# Patient Record
Sex: Male | Born: 1940 | Race: White | Hispanic: No | Smoking: Former smoker
Health system: Southern US, Community
[De-identification: ages and names within clinical notes are randomized; demographics above are authoritative.]

## PROBLEM LIST (undated history)

## (undated) DIAGNOSIS — N529 Male erectile dysfunction, unspecified: Secondary | ICD-10-CM

## (undated) DIAGNOSIS — F0280 Dementia in other diseases classified elsewhere without behavioral disturbance: Secondary | ICD-10-CM

## (undated) DIAGNOSIS — E785 Hyperlipidemia, unspecified: Secondary | ICD-10-CM

## (undated) DIAGNOSIS — Z87442 Personal history of urinary calculi: Secondary | ICD-10-CM

## (undated) DIAGNOSIS — N400 Enlarged prostate without lower urinary tract symptoms: Secondary | ICD-10-CM

## (undated) DIAGNOSIS — K5792 Diverticulitis of intestine, part unspecified, without perforation or abscess without bleeding: Secondary | ICD-10-CM

## (undated) DIAGNOSIS — G309 Alzheimer's disease, unspecified: Principal | ICD-10-CM

## (undated) DIAGNOSIS — T7840XA Allergy, unspecified, initial encounter: Secondary | ICD-10-CM

## (undated) DIAGNOSIS — N486 Induration penis plastica: Secondary | ICD-10-CM

## (undated) HISTORY — DX: Diverticulitis of intestine, part unspecified, without perforation or abscess without bleeding: K57.92

## (undated) HISTORY — DX: Induration penis plastica: N48.6

## (undated) HISTORY — PX: TONSILLECTOMY: SHX5217

## (undated) HISTORY — DX: Alzheimer's disease, unspecified: G30.9

## (undated) HISTORY — DX: Male erectile dysfunction, unspecified: N52.9

## (undated) HISTORY — DX: Benign prostatic hyperplasia without lower urinary tract symptoms: N40.0

## (undated) HISTORY — DX: Dementia in other diseases classified elsewhere without behavioral disturbance: F02.80

## (undated) HISTORY — DX: Hyperlipidemia, unspecified: E78.5

## (undated) HISTORY — DX: Personal history of urinary calculi: Z87.442

## (undated) HISTORY — DX: Allergy, unspecified, initial encounter: T78.40XA

---

## 2004-09-26 ENCOUNTER — Encounter: Payer: Self-pay | Admitting: Internal Medicine

## 2004-11-18 ENCOUNTER — Ambulatory Visit: Payer: Self-pay | Admitting: Internal Medicine

## 2005-11-24 ENCOUNTER — Ambulatory Visit: Payer: Self-pay | Admitting: Internal Medicine

## 2006-02-15 ENCOUNTER — Ambulatory Visit: Payer: Self-pay | Admitting: Internal Medicine

## 2006-05-03 ENCOUNTER — Ambulatory Visit: Payer: Self-pay | Admitting: Internal Medicine

## 2006-06-20 ENCOUNTER — Ambulatory Visit: Payer: Self-pay | Admitting: Family Medicine

## 2006-09-03 ENCOUNTER — Ambulatory Visit: Payer: Self-pay | Admitting: Internal Medicine

## 2006-11-30 ENCOUNTER — Ambulatory Visit: Payer: Self-pay | Admitting: Internal Medicine

## 2006-12-17 ENCOUNTER — Ambulatory Visit: Payer: Self-pay | Admitting: Gastroenterology

## 2007-02-08 ENCOUNTER — Ambulatory Visit: Payer: Self-pay | Admitting: Gastroenterology

## 2008-02-13 ENCOUNTER — Ambulatory Visit: Payer: Self-pay | Admitting: Internal Medicine

## 2008-02-13 DIAGNOSIS — L570 Actinic keratosis: Secondary | ICD-10-CM

## 2008-02-13 DIAGNOSIS — N400 Enlarged prostate without lower urinary tract symptoms: Secondary | ICD-10-CM | POA: Insufficient documentation

## 2008-02-13 DIAGNOSIS — N529 Male erectile dysfunction, unspecified: Secondary | ICD-10-CM

## 2008-02-13 DIAGNOSIS — E785 Hyperlipidemia, unspecified: Secondary | ICD-10-CM | POA: Insufficient documentation

## 2008-02-13 DIAGNOSIS — J309 Allergic rhinitis, unspecified: Secondary | ICD-10-CM | POA: Insufficient documentation

## 2008-02-13 DIAGNOSIS — J45909 Unspecified asthma, uncomplicated: Secondary | ICD-10-CM | POA: Insufficient documentation

## 2008-02-13 DIAGNOSIS — K5730 Diverticulosis of large intestine without perforation or abscess without bleeding: Secondary | ICD-10-CM | POA: Insufficient documentation

## 2008-02-18 LAB — CONVERTED CEMR LAB
ALT: 25 units/L (ref 0–53)
AST: 28 units/L (ref 0–37)
BUN: 19 mg/dL (ref 6–23)
Basophils Relative: 0 % (ref 0.0–1.0)
Bilirubin, Direct: 0.1 mg/dL (ref 0.0–0.3)
Cholesterol: 160 mg/dL (ref 0–200)
Eosinophils Relative: 2.5 % (ref 0.0–5.0)
GFR calc non Af Amer: 79 mL/min
HCT: 45.3 % (ref 39.0–52.0)
Hemoglobin: 15 g/dL (ref 13.0–17.0)
LDL Cholesterol: 96 mg/dL (ref 0–99)
Monocytes Absolute: 0.3 10*3/uL (ref 0.2–0.7)
Neutrophils Relative %: 69 % (ref 43.0–77.0)
PSA: 1.83 ng/mL (ref 0.10–4.00)
Phosphorus: 3.4 mg/dL (ref 2.3–4.6)
Potassium: 4.1 meq/L (ref 3.5–5.1)
RBC: 4.91 M/uL (ref 4.22–5.81)
RDW: 12.9 % (ref 11.5–14.6)
Sodium: 140 meq/L (ref 135–145)
Total Bilirubin: 0.9 mg/dL (ref 0.3–1.2)
Total CHOL/HDL Ratio: 3.3
VLDL: 15 mg/dL (ref 0–40)
WBC: 5.7 10*3/uL (ref 4.5–10.5)

## 2008-04-23 ENCOUNTER — Telehealth: Payer: Self-pay | Admitting: Internal Medicine

## 2008-05-29 ENCOUNTER — Ambulatory Visit: Payer: Self-pay | Admitting: Internal Medicine

## 2009-02-24 ENCOUNTER — Ambulatory Visit: Payer: Self-pay | Admitting: Internal Medicine

## 2009-02-25 LAB — CONVERTED CEMR LAB
Albumin: 4.1 g/dL (ref 3.5–5.2)
Alkaline Phosphatase: 76 units/L (ref 39–117)
Basophils Absolute: 0 10*3/uL (ref 0.0–0.1)
Bilirubin, Direct: 0 mg/dL (ref 0.0–0.3)
Cholesterol: 167 mg/dL (ref 0–200)
Eosinophils Absolute: 0.2 10*3/uL (ref 0.0–0.7)
LDL Cholesterol: 100 mg/dL — ABNORMAL HIGH (ref 0–99)
Lymphocytes Relative: 22.9 % (ref 12.0–46.0)
MCHC: 34.8 g/dL (ref 30.0–36.0)
MCV: 92.6 fL (ref 78.0–100.0)
Monocytes Absolute: 0.5 10*3/uL (ref 0.1–1.0)
Neutrophils Relative %: 63.4 % (ref 43.0–77.0)
Phosphorus: 3 mg/dL (ref 2.3–4.6)
Platelets: 167 10*3/uL (ref 150.0–400.0)
Potassium: 4.2 meq/L (ref 3.5–5.1)
RBC: 4.48 M/uL (ref 4.22–5.81)
Sodium: 141 meq/L (ref 135–145)
TSH: 3.46 microintl units/mL (ref 0.35–5.50)
Total CHOL/HDL Ratio: 3
Total Protein: 6.8 g/dL (ref 6.0–8.3)
Triglycerides: 47 mg/dL (ref 0.0–149.0)
VLDL: 9.4 mg/dL (ref 0.0–40.0)

## 2009-08-27 ENCOUNTER — Ambulatory Visit: Payer: Self-pay | Admitting: Internal Medicine

## 2010-02-18 ENCOUNTER — Ambulatory Visit: Payer: Self-pay | Admitting: Internal Medicine

## 2010-02-21 LAB — CONVERTED CEMR LAB
ALT: 28 units/L (ref 0–53)
AST: 24 units/L (ref 0–37)
Alkaline Phosphatase: 66 units/L (ref 39–117)
BUN: 12 mg/dL (ref 6–23)
Bilirubin, Direct: 0.1 mg/dL (ref 0.0–0.3)
Calcium: 9 mg/dL (ref 8.4–10.5)
Cholesterol: 166 mg/dL (ref 0–200)
Eosinophils Absolute: 0.1 10*3/uL (ref 0.0–0.7)
Eosinophils Relative: 2.3 % (ref 0.0–5.0)
GFR calc non Af Amer: 88.97 mL/min (ref >60)
Glucose, Bld: 94 mg/dL (ref 70–99)
HCT: 42.1 % (ref 39.0–52.0)
Lymphs Abs: 0.9 10*3/uL (ref 0.7–4.0)
MCHC: 33.4 g/dL (ref 30.0–36.0)
MCV: 93.9 fL (ref 78.0–100.0)
Monocytes Absolute: 0.4 10*3/uL (ref 0.1–1.0)
PSA: 2.92 ng/mL (ref 0.10–4.00)
Phosphorus: 2.7 mg/dL (ref 2.3–4.6)
Platelets: 161 10*3/uL (ref 150.0–400.0)
Sodium: 142 meq/L (ref 135–145)
Total Bilirubin: 0.6 mg/dL (ref 0.3–1.2)
Total Protein: 6.7 g/dL (ref 6.0–8.3)
VLDL: 11.4 mg/dL (ref 0.0–40.0)
WBC: 4.4 10*3/uL — ABNORMAL LOW (ref 4.5–10.5)

## 2010-04-05 ENCOUNTER — Telehealth: Payer: Self-pay | Admitting: Internal Medicine

## 2010-07-22 ENCOUNTER — Ambulatory Visit: Payer: Self-pay | Admitting: Internal Medicine

## 2010-09-27 ENCOUNTER — Ambulatory Visit: Payer: Self-pay | Admitting: Internal Medicine

## 2010-10-28 ENCOUNTER — Ambulatory Visit: Payer: Self-pay | Admitting: Internal Medicine

## 2010-11-01 LAB — CONVERTED CEMR LAB
ALT: 22 units/L (ref 0–53)
AST: 20 units/L (ref 0–37)
Albumin: 4.7 g/dL (ref 3.5–5.2)
Basophils Absolute: 0 10*3/uL (ref 0.0–0.1)
Basophils Relative: 0 % (ref 0–1)
Calcium: 9.4 mg/dL (ref 8.4–10.5)
Chloride: 105 meq/L (ref 96–112)
MCHC: 34.4 g/dL (ref 30.0–36.0)
Neutro Abs: 4.9 10*3/uL (ref 1.7–7.7)
Neutrophils Relative %: 74 % (ref 43–77)
Potassium: 4.5 meq/L (ref 3.5–5.3)
RBC: 4.72 M/uL (ref 4.22–5.81)
RDW: 13.3 % (ref 11.5–15.5)
Total Protein: 6.9 g/dL (ref 6.0–8.3)

## 2010-12-30 ENCOUNTER — Ambulatory Visit
Admission: RE | Admit: 2010-12-30 | Discharge: 2010-12-30 | Payer: Self-pay | Source: Home / Self Care | Attending: Internal Medicine | Admitting: Internal Medicine

## 2011-01-11 NOTE — Assessment & Plan Note (Signed)
Summary: cpx/rbh   Vital Signs:  Patient profile:   70 year old male Weight:      193 pounds BMI:     29.45 Temp:     98.4 degrees F oral Pulse rate:   72 / minute Pulse rhythm:   regular BP sitting:   108 / 70  (left arm) Cuff size:   large  Vitals Entered By: Mervin Hack CMA Duncan Dull) (February 18, 2010 9:50 AM) CC: adult physical   History of Present Illness: Doing well Having some memory problems--forgets little things, like whether he took meds or not Girlfriend has noted only slight issues No apraxia No apparent change in executive function  No other new issues  Allergies: 1)  ! Penicillin  Past History:  Past medical, surgical, family and social histories (including risk factors) reviewed for relevance to current acute and chronic problems.  Past Medical History: Reviewed history from 05/29/2008 and no changes required. Allergic rhinitis Asthma---post-infectious  2/98 Diverticulosis, colon Hyperlipidemia Peyronie's Erectile dysfunction Benign prostatic hypertrophy Nephrolithiasis, hx of--3/09  Past Surgical History: Reviewed history from 02/24/2009 and no changes required. Tonsillectomy  Family History: Reviewed history from 02/13/2008 and no changes required. Dad died of alcoholism. 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 Eyes:  Denies double vision and vision loss-1 eye. ENT:  Denies decreased hearing and ringing in ears; teeth okay--regular with dentist. CV:  Denies chest pain or discomfort, difficulty breathing at night, difficulty breathing while lying down, fainting, lightheadness, palpitations, and  shortness of breath with exertion. Resp:  Denies cough and shortness of breath. GI:  Denies abdominal pain, bloody stools, change in bowel habits, dark tarry stools, indigestion, nausea, and vomiting. GU:  Complains of erectile dysfunction and urinary hesitancy; denies nocturia; slow urine--occ leakage satisfied with meds. MS:  Complains of joint pain; denies joint swelling; occ mild finger pain--occ takes asa. Derm:  Denies lesion(s) and rash. Neuro:  Denies headaches, numbness, tingling, and weakness. Psych:  Denies anxiety and depression. Heme:  Denies abnormal bruising and enlarge lymph nodes. Allergy:  Complains of seasonal allergies and sneezing; uses meds mostly in summer.  Physical Exam  General:  alert and normal appearance.   Eyes:  pupils equal, pupils round, pupils reactive to light, and no optic disk abnormalities.   Ears:  R ear normal and L ear normal.   Mouth:  no erythema, no exudates, and no lesions.   Neck:  supple, no masses, no thyromegaly, no carotid bruits, and no cervical lymphadenopathy.   Lungs:  normal respiratory effort and normal breath sounds.   Heart:  normal rate, regular rhythm, no murmur, and no gallop.   Abdomen:  soft and non-tender.   Rectal:  no hemorrhoids and no masses.   Prostate:  no nodules.  Very slight gland enlargement but retained median sulcus Msk:  no joint tenderness and no joint swelling.   Pulses:  1+ in feet Extremities:  no edema Neurologic:  alert & oriented X3, strength normal in all extremities, and gait normal.   Skin:  no rashes and no ulcerations.   Multiple benign nevi--discussed looking for change Axillary Nodes:  No palpable lymphadenopathy Psych:  normally interactive, good eye contact, not  anxious appearing, and not depressed appearing.     Impression & Recommendations:  Problem # 1:  PREVENTIVE HEALTH CARE (ICD-V70.0) Assessment Comment Only due for PSA discussed proper eating  Problem # 2:  BENIGN PROSTATIC  HYPERTROPHY (ICD-600.00) Assessment: Unchanged  mild symptoms okay without meds  Orders: TLB-PSA (Prostate Specific Antigen) (84153-PSA)  Problem # 3:  HYPERLIPIDEMIA (ICD-272.4) Assessment: Unchanged  due for labs  His updated medication list for this problem includes:    Lipitor 10 Mg Tabs (Atorvastatin calcium) .Marland Kitchen... Take 1/2  tablet by mouth once a day  Labs Reviewed: SGOT: 31 (02/24/2009)   SGPT: 30 (02/24/2009)   HDL:58.00 (02/24/2009), 49.0 (02/13/2008)  LDL:100 (02/24/2009), 96 (28/31/5176)  Chol:167 (02/24/2009), 160 (02/13/2008)  Trig:47.0 (02/24/2009), 75 (02/13/2008)  Orders: TLB-Lipid Panel (80061-LIPID) TLB-Hepatic/Liver Function Pnl (80076-HEPATIC) Venipuncture (16073) TLB-TSH (Thyroid Stimulating Hormone) (84443-TSH) TLB-CBC Platelet - w/Differential (85025-CBCD) TLB-Renal Function Panel (80069-RENAL)  Problem # 4:  ALLERGIC RHINITIS (ICD-477.9) Assessment: Unchanged uses as needed   His updated medication list for this problem includes:    Flonase 50 Mcg/act Susp (Fluticasone propionate) .Marland Kitchen... 2 sprays each nostril daily  Complete Medication List: 1)  Lipitor 10 Mg Tabs (Atorvastatin calcium) .... Take 1/2  tablet by mouth once a day 2)  Flonase 50 Mcg/act Susp (Fluticasone propionate) .... 2 sprays each nostril daily 3)  Doxazosin Mesylate 4 Mg Tabs (Doxazosin mesylate) .Marland Kitchen.. 1 at bedtime 4)  Viagra 100 Mg Tabs (Sildenafil citrate) .... 1/2 -1 tab about 30-60 minutes before sex  Patient Instructions: 1)  Please schedule a follow-up appointment in 1 year.   Current Allergies (reviewed today): ! PENICILLIN   Tetanus/Td Immunization History:    Tetanus/Td # 1:  Historical (11/30/2006)  Pneumovax Immunization History:    Pneumovax # 1:  Historical (11/30/2006)

## 2011-01-11 NOTE — Assessment & Plan Note (Signed)
Summary: BALL ON BOTTOM OF LEFT FOOT/JRR   Vital Signs:  Patient profile:   70 year old male Weight:      186 pounds Temp:     98.2 degrees F oral BP sitting:   130 / 80  (left arm) Cuff size:   large  Vitals Entered By: Mervin Hack CMA Duncan Dull) (September 27, 2010 9:04 AM) CC: foot pain   History of Present Illness: Having trouble with pain in left forefoot feels "a rock" in there tries to keep it filed down  started several weeks ago No injuries  hasn't changed shoes  walks a lot Uses "tennis shoe"--- he has reasonable Nikes here today  Allergies: 1)  ! Penicillin  Past History:  Past medical, surgical, family and social histories (including risk factors) reviewed for relevance to current acute and chronic problems.  Past Medical History: Reviewed history from 05/29/2008 and no changes required. Allergic rhinitis Asthma---post-infectious  2/98 Diverticulosis, colon Hyperlipidemia Peyronie's Erectile dysfunction Benign prostatic hypertrophy Nephrolithiasis, hx of--3/09  Past Surgical History: Reviewed history from 02/24/2009 and no changes required. Tonsillectomy  Family History: Reviewed history from 02/13/2008 and no changes required. Dad died of alcoholism. Had prostate cancer Mom died of Altzheimers @90  1 brother No CAD, DM, HTN No colon cancer  Social History: Reviewed history from 02/18/2010 and no changes required. 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   Msk:  no visible abnormality on left foot mild tenderness in web space between 2nd and 3rd toes no abnormal pressure or crossing of toes Skin:  no warts   Impression & Recommendations:  Problem # 1:  FOOT PAIN, LEFT (ICD-729.5) Assessment New seems mechanical ??Morton's neuroma  P: try forefoot  support, either in new shoes or with padding under toes    podiatry if continues  Complete Medication List: 1)  Flonase 50 Mcg/act Susp (Fluticasone propionate) .... 2 sprays each nostril daily 2)  Doxazosin Mesylate 4 Mg Tabs (Doxazosin mesylate) .Marland Kitchen.. 1 at bedtime 3)  Viagra 100 Mg Tabs (Sildenafil citrate) .... 1/2 -1 tab about 30-60 minutes before sex 4)  Pravastatin Sodium 80 Mg Tabs (Pravastatin sodium) .... Take 1 by mouth once daily  Patient Instructions: 1)  Consider trying shoes with forefoot support. 2)  If pain persists, the next step is evaluation by a podiatrist 3)  Please schedule a follow-up appointment in 6 months for physical   Orders Added: 1)  Est. Patient Level III [59563]    Current Allergies (reviewed today): ! PENICILLIN  Appended Document: BALL ON BOTTOM OF LEFT FOOT/JRR     Clinical Lists Changes  Orders: Added new Service order of Influenza Vaccine MCR [phone]) - Signed Observations: Added new observation of FLU VAX#1VIS: 07/05/10 version given September 27, 2010. (09/27/2010 9:37) Added new observation of FLU VAXLOT: [id number] (09/27/2010 9:37) Added new observation of FLU VAX EXP: 06/10/2011 (09/27/2010 9:37) Added new observation of FLU VAXBY: DeShannon Smith CMA (AAMA) (09/27/2010 9:37) Added new observation of FLU VAXRTE: IM (09/27/2010 9:37) Added new observation of FLU VAX DSE: 0.5 ml (09/27/2010 9:37) Added new observation of FLU VAXMFR: GlaxoSmithKline (09/27/2010 9:37) Added new observation of FLU VAX SITE: right deltoid (09/27/2010 9:37) Added new observation of FLU VAX: Fluvax MCR (09/27/2010 9:37)       Influenza Vaccine    Vaccine Type: Fluvax MCR    Site: right deltoid  Mfr: GlaxoSmithKline    Dose: 0.5 ml    Route: IM    Given by: Mervin Hack CMA (AAMA)    Exp. Date: 06/10/2011    Lot #: [id number]    VIS given: 07/05/10 version given September 27, 2010.  Flu Vaccine Consent Questions    Do you have a history of  severe allergic reactions to this vaccine? no    Any prior history of allergic reactions to egg and/or gelatin? no    Do you have a sensitivity to the preservative Thimersol? no    Do you have a past history of Guillan-Barre Syndrome? no    Do you currently have an acute febrile illness? no    Have you ever had a severe reaction to latex? no    Vaccine information given and explained to patient? yes

## 2011-01-11 NOTE — Progress Notes (Signed)
Summary: Prior Authorization Lipitor  Phone Note From Pharmacy Call back at ph [phone] fax [phone]   [facility]er[facility]  [address] [phone]* Call For: Dr. Alphonsus Sias  Summary of Call: Received form from pharmacy stating that PA is needed for Lipitor 20.  Rafael Hernandez at [phone] and they will fax form today.  Linde Gillis CMA Duncan Dull)  April 05, 2010 10:18 AM   Still have not received PA form, called Tedd Sias again today and they had the wrong fax number, will send form now.  Linde Gillis CMA (AAMA)  April 07, 2010 9:22 AM   PA form in your IN box.  Initial call taken by: Linde Gillis CMA Duncan Dull),  April 07, 2010 10:45 AM  Follow-up for Phone Call        Please call him His insurance is not willing to fill lipitor unless he has had a documented failure on simvastatin (zocor) or crestor. Has he ever used another chol med? If not, is he willing to try one of these? Follow-up by: Cindee Salt MD,  April 07, 2010 1:10 PM  Additional Follow-up for Phone Call Additional follow up Details #1::        left message on machine at home for patient to return my call.  DeShannon Smith CMA Duncan Dull)  April 07, 2010 3:11 PM    spoke with patient and he has never tried either of these meds, he is willing to try. DeShannon Katrinka Blazing CMA Duncan Dull)  April 08, 2010 8:26   Please send Rx for pravastatin 80mg  daily This is most likely to control his chol like 20mg  of lipitor 1 year Rx Have him check lipid, hepatic in about 1 month Additional Follow-up by: Cindee Salt MD,  April 08, 2010 8:58 AM    Additional Follow-up for Phone Call Additional follow up Details #2::    lmom to have pt re-check labs in 1 month and rx was sent to pharmacy Follow-up by: Mervin Hack CMA [facility] Dull),  April 08, 2010 11:04 AM  New/Updated Medications: PRAVASTATIN SODIUM 80 MG TABS (PRAVASTATIN SODIUM) take 1 by mouth once daily Prescriptions: PRAVASTATIN SODIUM 80 MG TABS (PRAVASTATIN SODIUM) take 1 by mouth once  daily  #30 x 12   Entered by:   Mervin Hack CMA (AAMA)   Authorized by:   Cindee Salt MD   Signed by:   Mervin Hack CMA (AAMA) on 04/08/2010   Method used:   [facility]ically to        [facility] [phone]* (retail)       [address]       [place], [place]  [address]       Ph: [phone]       Fax: [phone]   RxID:   [id number]

## 2011-01-11 NOTE — Assessment & Plan Note (Signed)
Summary: pain in leg/alc   Vital Signs:  Patient profile:   70 year old male Weight:      193 pounds Temp:     98.3 degrees F oral BP sitting:   138 / 68  (left arm) Cuff size:   large  Vitals Entered By: Mervin Hack CMA Duncan Dull) (July 22, 2010 9:14 AM) CC: pain in left hip   History of Present Illness: Having pain in lateral left hip for about 1 week No known injury Notices it mostly when lying down, slightly when sitting No problems with walking or being active Pain gets better with changes in postiion does help to not be lying on that side  Aleve has helped if pain acts up more  Allergies: 1)  ! Penicillin  Past History:  Past medical, surgical, family and social histories (including risk factors) reviewed for relevance to current acute and chronic problems.  Past Medical History: Reviewed history from 05/29/2008 and no changes required. Allergic rhinitis Asthma---post-infectious  2/98 Diverticulosis, colon Hyperlipidemia Peyronie's Erectile dysfunction Benign prostatic hypertrophy Nephrolithiasis, hx of--3/09  Past Surgical History: Reviewed history from 02/24/2009 and no changes required. Tonsillectomy  Family History: Reviewed history from 02/13/2008 and no changes required. Dad died of alcoholism. Had prostate cancer Mom died of Altzheimers @90  1 brother No CAD, DM, HTN No colon cancer  Social History: Reviewed history from 02/18/2010 and no changes required. 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   Msk:  no joint tenderness and no joint swelling.   No back tenderness Mild tenderness  ~4-8cm below anterior superior iliac  spine on left Neurologic:  strength normal in all extremities and gait  normal.     Impression & Recommendations:  Problem # 1:  TROCHANTERIC BURSITIS, LEFT (ICD-726.5) Assessment New discussed that should resolve on its own--fairly mild  will use ice and aleve at bedtime for now  Complete Medication List: 1)  Flonase 50 Mcg/act Susp (Fluticasone propionate) .... 2 sprays each nostril daily 2)  Doxazosin Mesylate 4 Mg Tabs (Doxazosin mesylate) .Marland Kitchen.. 1 at bedtime 3)  Viagra 100 Mg Tabs (Sildenafil citrate) .... 1/2 -1 tab about 30-60 minutes before sex 4)  Pravastatin Sodium 80 Mg Tabs (Pravastatin sodium) .... Take 1 by mouth once daily  Patient Instructions: 1)  Please try ice on the hip for 5-10 minutes and 1-2 aleve before bedtime 2)  Call for injection if pain persists for many weeks 3)  Keep your regualr follow up appt  Current Allergies (reviewed today): ! PENICILLIN

## 2011-01-11 NOTE — Assessment & Plan Note (Signed)
Summary: TROUBLE W/ MEMORY/CLE   Vital Signs:  Patient profile:   70 year old male Weight:      189 pounds Temp:     98.5 degrees F oral Pulse rate:   80 / minute Pulse rhythm:   regular BP sitting:   158 / 80  (left arm) Cuff size:   regular  Vitals Entered By: Mervin Hack CMA Duncan Dull) (October 28, 2010 2:29 PM) CC: problem with memory   History of Present Illness: Here with long term girlfriend-- Bonita Quin something of a long distance relationship  Has had worsening of his memory Probaby goes back about 1 year Will make appt and forgot he did this Puts things away and forgets where remembers "the important things" Clearly has been worsening  Still pays bills and handles all the finances---he pays them as soon as they come in No problems keeping up with 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 Penicillin  Past History:  Past medical, surgical, family and social histories (including risk factors) reviewed for relevance to current acute and chronic problems.  Past Medical History: Reviewed history from 05/29/2008 and no changes required. Allergic rhinitis Asthma---post-infectious  2/98 Diverticulosis, colon Hyperlipidemia Peyronie's Erectile dysfunction Benign prostatic hypertrophy Nephrolithiasis, hx of--3/09  Past Surgical History: Reviewed history from 02/24/2009 and no changes required. Tonsillectomy  Family History: Reviewed history from 02/13/2008 and no changes  required. Dad died of alcoholism. Had prostate cancer Mom died of Altzheimers @90  1 brother No CAD, DM, HTN No colon cancer  Social History: Reviewed history from 02/18/2010 and no changes required. 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   Neurologic:  strength normal in all extremities and gait normal.   Clock draw is normal President-- "Obama, ????" "October, 11" recall 0/3 after about 3 minutes Psych:  normally interactive, good eye contact, not anxious appearing, and not depressed appearing.     Impression & Recommendations:  Problem # 1:  MEMORY LOSS (ICD-780.93) Assessment New  has had some problems but seems to be worsening  biggest argument against Altzheimers is his realization of the problem ??statin related metabolic doesn't appear likely to be vascular no neurologic findings  will check labs stop pravastatin recheck in 1-2 months consider MRI  counselled more than half of 25 minute visit  Orders: T-Comprehensive Metabolic Panel (16109-60454) T-CBC w/Diff (09811-91478) T-Vitamin B12 [id number]) T-Syphilis Test (RPR) (57846-96295) T-T4, Free (28413-24401) Venipuncture (02725) Specimen Handling (36644)  Complete Medication List: 1)  Flonase 50 Mcg/act Susp (Fluticasone propionate) .... 2 sprays each nostril daily 2)  Doxazosin Mesylate 4 Mg Tabs (Doxazosin mesylate) .Marland Kitchen.. 1 at bedtime 3)  Viagra 100 Mg Tabs (Sildenafil citrate) .... 1/2 -1 tab about 30-60 minutes before sex  Patient Instructions: 1)  Please schedule  a follow-up appointment in 1-2  months 2)  Please stop the pravastatin   Orders Added: 1)  T-Comprehensive Metabolic Panel [80053-22900] 2)  T-CBC w/Diff  [16109-60454] 3)  T-Vitamin B12 [82607-23330] 4)  T-Syphilis Test (RPR) [09811-91478] 5)  T-T4, Free [29562-13086] 6)  Venipuncture [57846] 7)  Specimen Handling [99000] 8)  Est. Patient Level IV [96295]    Current Allergies (reviewed today): ! PENICILLIN

## 2011-01-12 NOTE — Assessment & Plan Note (Signed)
Summary: 1-2 month follow up/rbh   Vital Signs:  Patient profile:   70 year old male Weight:      189 pounds BMI:     28.84 Temp:     98.7 degrees F oral Pulse rate:   68 / minute Pulse rhythm:   regular BP sitting:   139 / 72  (left arm) Cuff size:   large  Vitals Entered By: Mervin Hack CMA Duncan Dull) (December 30, 2010 2:54 PM) CC: 1-2 month follow-up   History of Present Illness: Did start the vitamin B12  He and girlfirend hav not noticed any clear cut difference No decline noted either  Has had intermittent depressed mood frustrated with memory problems He feels he hasn't given up anything but girlfriend notes he doesn't go on the computer as much, not checking his e-mail as much, etc  Allergies: 1)  ! Penicillin  Past History:  Past medical, surgical, family and social histories (including risk factors) reviewed for relevance to current acute and chronic problems.  Past Medical History: Reviewed history from 05/29/2008 and no changes required. Allergic rhinitis Asthma---post-infectious  2/98 Diverticulosis, colon Hyperlipidemia Peyronie's Erectile dysfunction Benign prostatic hypertrophy Nephrolithiasis, hx of--3/09  Past Surgical History: Reviewed history from 02/24/2009 and no changes required. Tonsillectomy  Family History: Reviewed history from 02/13/2008 and no changes required. Dad died of alcoholism. Had prostate cancer Mom died of Altzheimers @90  1 brother No CAD, DM, HTN No colon cancer  Social History: Reviewed history from 02/18/2010 and no changes required. 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   Psych:  normally interactive, good eye contact, not anxious appearing, and not depressed appearing.     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 2 sprays each nostril daily 2)  Doxazosin Mesylate 4 Mg Tabs (Doxazosin mesylate) .Marland Kitchen.. 1 at bedtime 3)  Viagra 100 Mg Tabs (Sildenafil citrate) .... 1/2 -1 tab about 30-60 minutes before sex  Patient Instructions: 1)  Please schedule a follow-up appointment in 4 months .    Orders Added: 1)  Est. Patient Level III [42595]    Current Allergies (reviewed today): ! PENICILLIN

## 2011-02-01 ENCOUNTER — Encounter: Payer: Self-pay | Admitting: Internal Medicine

## 2011-02-16 NOTE — Miscellaneous (Signed)
Summary: Health Care Power of Attorney and Advance Directive for a [facility] Power of [place] and Advance Directive for a Natural Death   Imported By: Beau Fanny 02/07/2011 10:53:18  _____________________________________________________________________  External Attachment:    Type:   Image     Comment:   External Document

## 2011-02-20 ENCOUNTER — Encounter: Payer: Self-pay | Admitting: Internal Medicine

## 2011-02-20 ENCOUNTER — Encounter (INDEPENDENT_AMBULATORY_CARE_PROVIDER_SITE_OTHER): Payer: Medicare Other | Admitting: Internal Medicine

## 2011-02-20 ENCOUNTER — Other Ambulatory Visit: Payer: Self-pay | Admitting: Internal Medicine

## 2011-02-20 DIAGNOSIS — Z0000 Encounter for general adult medical examination without abnormal findings: Secondary | ICD-10-CM

## 2011-02-20 DIAGNOSIS — E538 Deficiency of other specified B group vitamins: Secondary | ICD-10-CM

## 2011-02-28 NOTE — Assessment & Plan Note (Signed)
Summary: CPX/DLO   Vital Signs:  Patient profile:   70 year old male Weight:      188 pounds Temp:     98.7 degrees F oral Pulse rate:   66 / minute Pulse rhythm:   regular BP sitting:   140 / 80  (left arm) Cuff size:   large  Vitals Entered By: Mervin Hack CMA Duncan Dull) (February 20, 2011 3:09 PM) CC: adult physical   History of Present Illness: He feels his memory is worse "little things" are harder---has to concentrate on things more, repeat things in his mind forgets where he puts things or whether he paid a bill Girlfriend notes some difference but she is not here today Hasn't gotten lost in car or walking around Mostly just misplaces things   Allergies: 1)  ! Penicillin  Past History:  Past medical, surgical, family and social histories (including risk factors) reviewed for relevance to current acute and chronic problems.  Past Medical History: Allergic rhinitis Asthma---post-infectious  2/98 Diverticulosis, colon Hyperlipidemia Peyronie's Erectile dysfunction Benign prostatic hypertrophy Nephrolithiasis, hx of--3/09 Mild cognitive impairment  Past Surgical History: Reviewed history from 02/24/2009 and no changes required. Tonsillectomy  Family History: Reviewed history from 02/13/2008 and no changes required. Dad died of alcoholism. Had prostate cancer Mom died of Altzheimers @90  1 brother No CAD, DM, HTN No colon cancer  Social History: Reviewed history from 02/18/2010 and no changes required. 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 Eyes:  Denies double vision and vision loss-1 eye. ENT:  Denies decreased hearing and ringing in ears; tteth okay---keeps up with dentist. CV:  Denies chest pain or discomfort, difficulty breathing  at night, difficulty breathing while lying down, fainting, lightheadness, palpitations, and shortness of breath with exertion. Resp:  Denies cough and shortness of breath. GI:  Denies abdominal pain, bloody stools, change in bowel habits, dark tarry stools, indigestion, nausea, and vomiting. GU:  Complains of erectile dysfunction, urinary frequency, and urinary hesitancy; denies nocturia; satisfied with vaigra. MS:  Denies joint pain and joint swelling; has knot on DIP of left 3rd finger. Derm:  Denies lesion(s) and rash. Neuro:  Complains of memory loss; denies headaches, numbness, tingling, and weakness. Psych:  Denies anxiety and depression. Heme:  Denies abnormal bruising and enlarge lymph nodes. Allergy:  Complains of seasonal allergies and sneezing; uses OTC meds as needed .  Physical Exam  General:  alert and normal appearance.   Eyes:  pupils equal, pupils round, pupils reactive to light, and no injection.   Ears:  R ear normal and L ear normal.   Mouth:  no erythema, no exudates, and no lesions.   Neck:  supple, no masses, no thyromegaly, no carotid bruits, and no cervical lymphadenopathy.   Lungs:  normal respiratory effort, no intercostal retractions, no accessory muscle use, and normal breath sounds.   Heart:  normal rate, regular rhythm, no murmur, and no gallop.   Abdomen:  soft, non-tender, and no masses.   Prostate:  deferred after discussion Msk:  no joint tenderness and no joint swelling.   Pulses:  2+ in feet Extremities:  no edema Neurologic:  alert & oriented X3, strength normal in all extremities, and gait normal.   Skin:  no rashes and no suspicious lesions.   Axillary Nodes:  No palpable lymphadenopathy Psych:  normally interactive, good eye contact, not anxious appearing, and not depressed appearing.     Impression & Recommendations:  Problem # 1:  PREVENTIVE HEALTH CARE (ICD-V70.0) Assessment Comment Only discussed regular exercise discussed PSA---will  not check anymore  Problem # 2:  MILD COGNITIVE IMPAIRMENT SO STATED (ICD-331.83) Assessment: Unchanged not clearly worse no worrisome features will see back in 3 months with girlfriend to reassess vitamin B12 shots if level not up  Problem # 3:  BENIGN PROSTATIC HYPERTROPHY (ICD-600.00) Assessment: Unchanged fine on doxazosin  Complete Medication List: 1)  Doxazosin Mesylate 4 Mg Tabs (Doxazosin mesylate) .Marland Kitchen.. 1 at bedtime 2)  Viagra 100 Mg Tabs (Sildenafil citrate) .... 1/2 -1 tab about 30-60 minutes before sex 3)  Vitamin B-12 1000 Mcg Tabs (Cyanocobalamin) .Marland Kitchen.. 1 tab daily  Other Orders: Venipuncture (29562) TLB-B12, Serum-Total ONLY (13086-V78)  Patient Instructions: 1)  Please schedule a follow-up appointment in 3 months with girlfriend   Orders Added: 1)  Venipuncture [36415] 2)  TLB-B12, Serum-Total ONLY [82607-B12] 3)  Est. Patient 65& > [46962]    Current Allergies (reviewed today): ! PENICILLIN

## 2011-04-28 ENCOUNTER — Ambulatory Visit: Payer: Self-pay | Admitting: Internal Medicine

## 2011-06-01 ENCOUNTER — Encounter: Payer: Self-pay | Admitting: Internal Medicine

## 2011-06-02 ENCOUNTER — Encounter: Payer: Self-pay | Admitting: Internal Medicine

## 2011-06-02 ENCOUNTER — Ambulatory Visit (INDEPENDENT_AMBULATORY_CARE_PROVIDER_SITE_OTHER): Payer: Medicare Other | Admitting: Internal Medicine

## 2011-06-02 VITALS — BP 132/70 | HR 97 | Temp 98.5°F | Ht 68.0 in | Wt 190.0 lb

## 2011-06-02 DIAGNOSIS — G3184 Mild cognitive impairment, so stated: Secondary | ICD-10-CM

## 2011-06-02 MED ORDER — DONEPEZIL HCL 10 MG PO TABS: 10.0000 mg | ORAL_TABLET | Freq: Every day | ORAL | Status: DC

## 2011-06-02 NOTE — Progress Notes (Signed)
  Subjective:    Patient ID: Nicholas Kennedy, male    DOB: 10/18/1941, 70 y.o.   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 vitamin B-12 (CYANOCOBALAMIN) 1000 MCG tablet Take 1,000 mcg by mouth daily.        Marland Kitchen DISCONTD: doxazosin (CARDURA) 4 MG tablet Take 4 mg by mouth at bedtime.        Marland Kitchen DISCONTD: sildenafil (VIAGRA) 100 MG tablet Take 50-100 mg by mouth daily as needed.         Past Medical History  Diagnosis Date  . Allergy   . Asthma   . Diverticulitis   . Hyperlipidemia   . Peyronie's disease   . ED (erectile dysfunction)   . BPH (benign prostatic hypertrophy)   . History of nephrolithiasis   . Mild cognitive impairment     Past Surgical History  Procedure Date  . Tonsillectomy     Family History  Problem Relation Age of Onset  . Alzheimer's disease Mother   . Alcohol abuse Father   . Cancer Father     prostate cancer  . Coronary artery disease Neg Hx   . Hypertension Neg Hx   . Diabetes Neg Hx     History   Social History  . Marital Status: Married    Spouse Name: N/A    Number of Children: 3  . Years of Education: N/A   Occupational History  . retired Naval architect   . farm beef cattle    Social History Main Topics  . Smoking status: Former Games developer  . Smokeless tobacco: Never Used  . Alcohol  Use: Yes  . Drug Use: No  . Sexually Active: Not on file   Other Topics Concern  . Not on file   Social History Narrative  . No narrative on file   Review of Systems Appetite is okay Sleeps fine No depression    Objective:   Physical Exam  Constitutional: He is oriented to person, place, and time.  Neurological: He is alert and oriented to person, place, and time.       President--"colored fellow, Danae Orleans, not sure" [phone] D-l-[id number] Recall 0/3 at 1 minute          Assessment & Plan:

## 2011-06-02 NOTE — Assessment & Plan Note (Signed)
Seems to have some small progression He is ready to try medication Will start donepezil and titrate dose to 10mg  Discussed regular physical activity

## 2011-06-02 NOTE — Patient Instructions (Signed)
Please start the donepezil at 1/2 tab (5mg ) daily for 2 weeks. If no problems at that point, increase to a full tab daily Please try to have at least 30 minutes of vigorous physical activity daily

## 2011-08-04 ENCOUNTER — Ambulatory Visit: Payer: Medicare Other | Admitting: Internal Medicine

## 2011-08-07 ENCOUNTER — Encounter: Payer: Self-pay | Admitting: Internal Medicine

## 2011-08-07 ENCOUNTER — Ambulatory Visit (INDEPENDENT_AMBULATORY_CARE_PROVIDER_SITE_OTHER): Payer: Medicare Other | Admitting: Internal Medicine

## 2011-08-07 VITALS — BP 126/70 | HR 61 | Temp 98.0°F | Ht 68.0 in | Wt 187.0 lb

## 2011-08-07 DIAGNOSIS — G3184 Mild cognitive impairment, so stated: Secondary | ICD-10-CM

## 2011-08-07 NOTE — Progress Notes (Signed)
  Subjective:    Patient ID: Nicholas Kennedy, male    DOB: Oct 19, 1941, 70 y.o.   MRN: [id number]  HPI No problems with the medicine Girlfriend and he don't note any improvement No further decline  Brings in "procera AVH" cognitive enhancer Okay to try but should be limited trial Current Outpatient Prescriptions on File Prior to Visit  Medication Sig Dispense Refill  . donepezil (ARICEPT) 10 MG tablet Take 1 tablet (10 mg total) by mouth daily.  30 tablet  11  . vitamin B-12 (CYANOCOBALAMIN) 1000 MCG tablet Take 1,000 mcg by mouth daily.          Allergies  Allergen Reactions  . Penicillins     Past Medical History  Diagnosis Date  . Allergy   . Asthma   . Diverticulitis   . Hyperlipidemia   . Peyronie's disease   . ED (erectile dysfunction)   . BPH (benign prostatic hypertrophy)   . History of nephrolithiasis   . Mild cognitive impairment     Past Surgical History  Procedure Date  . Tonsillectomy     Family History  Problem Relation Age of Onset  . Alzheimer's disease Mother   . Alcohol abuse Father   . Cancer Father     prostate cancer  . Coronary artery disease Neg Hx   . Hypertension Neg Hx   . Diabetes Neg Hx     History   Social History  . Marital Status: Married    Spouse Name: N/A    Number of Children: 3  . Years of Education: N/A   Occupational History  . retired Naval architect   . farm beef cattle    Social History Main Topics  . Smoking status: Former Games developer  . Smokeless tobacco: Never Used  . Alcohol Use: Yes  . Drug Use: No  . Sexually Active: Not on file   Other Topics Concern  . Not on file   Social History Narrative  . No narrative on file      Review of Systems     Objective:   Physical Exam        Assessment & Plan:

## 2011-08-07 NOTE — Patient Instructions (Signed)
Please get full hearing evaluation at [facility] ENT audiologists

## 2011-08-07 NOTE — Assessment & Plan Note (Signed)
No clear response counselled over half of 15 minute visit He is willing to continue if chance of slowing decline Discussed hearing evaluation

## 2011-08-25 ENCOUNTER — Other Ambulatory Visit: Payer: Self-pay | Admitting: Internal Medicine

## 2011-11-20 ENCOUNTER — Other Ambulatory Visit: Payer: Self-pay | Admitting: Internal Medicine

## 2012-02-02 ENCOUNTER — Ambulatory Visit: Payer: Medicare Other | Admitting: Internal Medicine

## 2012-03-11 ENCOUNTER — Encounter: Payer: Self-pay | Admitting: Internal Medicine

## 2012-03-11 ENCOUNTER — Ambulatory Visit (INDEPENDENT_AMBULATORY_CARE_PROVIDER_SITE_OTHER): Payer: Medicare Other | Admitting: Internal Medicine

## 2012-03-11 VITALS — BP 128/68 | HR 65 | Temp 97.8°F | Ht 68.0 in | Wt 187.0 lb

## 2012-03-11 DIAGNOSIS — J309 Allergic rhinitis, unspecified: Secondary | ICD-10-CM

## 2012-03-11 DIAGNOSIS — J45909 Unspecified asthma, uncomplicated: Secondary | ICD-10-CM

## 2012-03-11 DIAGNOSIS — G3184 Mild cognitive impairment, so stated: Secondary | ICD-10-CM

## 2012-03-11 DIAGNOSIS — N400 Enlarged prostate without lower urinary tract symptoms: Secondary | ICD-10-CM

## 2012-03-11 MED ORDER — DOXAZOSIN MESYLATE 4 MG PO TABS: 4.0000 mg | ORAL_TABLET | Freq: Every day | ORAL | Status: DC

## 2012-03-11 MED ORDER — DONEPEZIL HCL 10 MG PO TABS: 10.0000 mg | ORAL_TABLET | Freq: Every day | ORAL | Status: DC

## 2012-03-11 NOTE — Assessment & Plan Note (Signed)
Mild memory worsening but no functional changes Still fine with executive function Continue donepezil---consider trial off in a year or so to see if there is a difference off the med

## 2012-03-11 NOTE — Progress Notes (Signed)
  Subjective:    Patient ID: Nicholas Kennedy, male    DOB: 12-May-1941, 71 y.o.   MRN: [id number]  HPI Here with girlfriend He feels his memory is worse Misplaces things Forgets some appts---tries to take notes Repeats questions  Still drives Never has gotten lost Still handles the finances--paying bills, etc  Still farms---cattle  Voiding okay No dizziness on cardura No nocturia  No asthma problems No wheezing, cough or SOB  Current Outpatient Prescriptions on File Prior to Visit  Medication Sig Dispense Refill  . VIAGRA 100 MG tablet TAKE 1/2 TO 1 TABLET ABOUT 30-60 MINUTES BEFORE SEX  6 tablet  3  . DISCONTD: donepezil (ARICEPT) 10 MG tablet Take 1 tablet (10 mg total) by mouth daily.  30 tablet  11  . DISCONTD: doxazosin (CARDURA) 4 MG tablet TAKE 1 TABLET BY MOUTH EVERY DAY AT BEDTIME  30 tablet  11    Allergies  Allergen Reactions  . Penicillins     Past Medical History  Diagnosis Date  . Allergy   . Asthma   . Diverticulitis   . Hyperlipidemia   . Peyronie's disease   . ED (erectile dysfunction)   . BPH (benign prostatic hypertrophy)   . History of nephrolithiasis   . Mild cognitive impairment     Past Surgical History  Procedure Date  . Tonsillectomy     Family History  Problem Relation Age of Onset  . Alzheimer's disease Mother   . Alcohol abuse Father   . Cancer Father     prostate cancer  . Coronary artery disease Neg Hx   . Hypertension Neg Hx   . Diabetes Neg Hx     History   Social History  . Marital Status: Married    Spouse Name: N/A    Number of Children: 3  . Years of Education: N/A   Occupational History  . retired Naval architect   . farm beef cattle    Social History Main Topics  . Smoking status: Former Games developer  . Smokeless tobacco: Never Used  . Alcohol Use: Yes  . Drug Use: No  . Sexually Active: Not on file   Other Topics Concern  . Not on file   Social History Narrative  . No narrative on file   Review of  Systems Sleeps well Gets irritated when girlfriend gets frustrated (at his repeated questions) Sleeps okay Mood generally okay    Objective:   Physical Exam  Constitutional: He appears well-developed and well-nourished. No distress.  Neck: Normal range of motion. Neck supple. No thyromegaly present.  Cardiovascular: Normal rate, regular rhythm and normal heart sounds.  Exam reveals no gallop.   No murmur heard. Pulmonary/Chest: Effort normal and breath sounds normal. No respiratory distress. He has no wheezes. He has no rales.  Abdominal: Soft. There is no tenderness.  Musculoskeletal: He exhibits no edema and no tenderness.  Lymphadenopathy:    He has no cervical adenopathy.  Psychiatric: He has a normal mood and affect. His behavior is normal.          Assessment & Plan:

## 2012-03-11 NOTE — Assessment & Plan Note (Signed)
Has been quiet 

## 2012-03-11 NOTE — Assessment & Plan Note (Signed)
Voids well with the med No changes

## 2012-03-11 NOTE — Assessment & Plan Note (Signed)
Starts in May---probably grass sensitive Okay with OTC meds

## 2012-10-11 ENCOUNTER — Encounter: Payer: Self-pay | Admitting: Internal Medicine

## 2012-10-11 ENCOUNTER — Ambulatory Visit (INDEPENDENT_AMBULATORY_CARE_PROVIDER_SITE_OTHER): Payer: Medicare Other | Admitting: Internal Medicine

## 2012-10-11 VITALS — BP 116/78 | HR 66 | Temp 98.0°F | Ht 66.5 in | Wt 183.2 lb

## 2012-10-11 DIAGNOSIS — N400 Enlarged prostate without lower urinary tract symptoms: Secondary | ICD-10-CM

## 2012-10-11 DIAGNOSIS — J45909 Unspecified asthma, uncomplicated: Secondary | ICD-10-CM

## 2012-10-11 DIAGNOSIS — Z0000 Encounter for general adult medical examination without abnormal findings: Secondary | ICD-10-CM

## 2012-10-11 DIAGNOSIS — Z23 Encounter for immunization: Secondary | ICD-10-CM

## 2012-10-11 DIAGNOSIS — E538 Deficiency of other specified B group vitamins: Secondary | ICD-10-CM

## 2012-10-11 DIAGNOSIS — G3184 Mild cognitive impairment, so stated: Secondary | ICD-10-CM

## 2012-10-11 LAB — CBC WITH DIFFERENTIAL/PLATELET
Basophils Absolute: 0 10*3/uL (ref 0.0–0.1)
Eosinophils Absolute: 0.1 10*3/uL (ref 0.0–0.7)
Hemoglobin: 14.9 g/dL (ref 13.0–17.0)
Lymphocytes Relative: 15.6 % (ref 12.0–46.0)
MCHC: 32.8 g/dL (ref 30.0–36.0)
Neutro Abs: 5.4 10*3/uL (ref 1.4–7.7)
Neutrophils Relative %: 76.6 % (ref 43.0–77.0)
RDW: 14.2 % (ref 11.5–14.6)

## 2012-10-11 LAB — BASIC METABOLIC PANEL
CO2: 28 mEq/L (ref 19–32)
Calcium: 9 mg/dL (ref 8.4–10.5)
Creatinine, Ser: 1.1 mg/dL (ref 0.4–1.5)
Glucose, Bld: 92 mg/dL (ref 70–99)

## 2012-10-11 LAB — HEPATIC FUNCTION PANEL
Albumin: 4.2 g/dL (ref 3.5–5.2)
Bilirubin, Direct: 0 mg/dL (ref 0.0–0.3)
Total Protein: 7.2 g/dL (ref 6.0–8.3)

## 2012-10-11 NOTE — Assessment & Plan Note (Signed)
Very quiet No symptoms and no Rx

## 2012-10-11 NOTE — Assessment & Plan Note (Signed)
No longer taking supplements Will recheck level

## 2012-10-11 NOTE — Progress Notes (Signed)
Subjective:    Patient ID: Nicholas Kennedy, male    DOB: [birth year], 71 y.o.   MRN: [id number]  HPI Here with girlfriend For wellness visit and follow up Reviewed advanced directives No depression or anhedonia Remains independent with ADLs and instrumental ADLs No smoking or alcohol No other regular doctors Due for flu vaccine Stays active on farm  Feels his reasoning is fine Still pays the bills Memory is poor---forgets things. Girlfriend notices him repeating himself--this is more noticeable She isn't sure he is taking the donepezil regularly--discussed using weekly or monthly pill containers  Voiding okay No dizziness on the med No nocturia  Asthma is quiet No cough or SOB No wheezing No rescue inhaler  Current Outpatient Prescriptions on File Prior to Visit  Medication Sig Dispense Refill  . donepezil (ARICEPT) 10 MG tablet Take 1 tablet (10 mg total) by mouth daily.  30 tablet  11  . doxazosin (CARDURA) 4 MG tablet Take 1 tablet (4 mg total) by mouth at bedtime.  30 tablet  11  . VIAGRA 100 MG tablet TAKE 1/2 TO 1 TABLET ABOUT 30-60 MINUTES BEFORE SEX  6 tablet  3    Allergies  Allergen Reactions  . Penicillins     Past Medical History  Diagnosis Date  . Allergy   . Asthma   . Diverticulitis   . Hyperlipidemia   . Peyronie's disease   . ED (erectile dysfunction)   . BPH (benign prostatic hypertrophy)   . History of nephrolithiasis   . Mild cognitive impairment     Past Surgical History  Procedure Date  . Tonsillectomy     Family History  Problem Relation Age of Onset  . Alzheimer's disease Mother   . Alcohol abuse Father   . Cancer Father     prostate cancer  . Coronary artery disease Neg Hx   . Hypertension Neg Hx   . Diabetes Neg Hx     History   Social History  . Marital Status: Married    Spouse Name: N/A    Number of Children: 3  . Years of Education: N/A   Occupational History  . retired Naval architect   . farm beef cattle     Social History Main Topics  . Smoking status: Former Games developer  . Smokeless tobacco: Never Used  . Alcohol Use: No  . Drug Use: No  . Sexually Active: Not on file   Other Topics Concern  . Not on file   Social History Narrative   No living willGirlfriend [employer] or son, Tawanna Cooler has health care POAWould accept resuscitationHasn't thought about feeding tube   Review of Systems Satisfied with the viagra Sleeps okay Appetite is good Weight is down 4# since last visit    Objective:   Physical Exam  Constitutional: He appears well-developed and well-nourished. No distress.  HENT:  Mouth/Throat: Oropharynx is clear and moist. No oropharyngeal exudate.  Eyes: Conjunctivae normal and EOM are normal. Pupils are equal, round, and reactive to light.  Neck: Normal range of motion. Neck supple.  Cardiovascular: Normal rate, regular rhythm, normal heart sounds and intact distal pulses.  Exam reveals no gallop.   No murmur heard. Pulmonary/Chest: Effort normal and breath sounds normal. No respiratory distress. He has no wheezes. He has no rales.  Abdominal: Soft. There is no tenderness.  Musculoskeletal: He exhibits no edema and no tenderness.  Lymphadenopathy:    He has no cervical adenopathy.  Neurological: He is alert.  Skin: No  rash noted. No erythema.  Psychiatric: He has a normal mood and affect. His behavior is normal.          Assessment & Plan:

## 2012-10-11 NOTE — Assessment & Plan Note (Signed)
?  some decline with more repeating Not taking donepezil daily---discussed daily med planner

## 2012-10-11 NOTE — Assessment & Plan Note (Signed)
I have personally reviewed the Medicare Annual Wellness questionnaire and have noted 1. The patient's medical and social history 2. Their use of alcohol, tobacco or illicit drugs 3. Their current medications and supplements 4. The patient's functional ability including ADL's, fall risks, home safety risks and hearing or visual             impairment. 5. Diet and physical activities 6. Evidence for depression or mood disorders  The patients weight, height, BMI and visual acuity have been recorded in the chart I have made referrals, counseling and provided education to the patient based review of the above and I have provided the pt with a written personalized care plan for preventive services.  I have provided you with a copy of your personalized plan for preventive services. Please take the time to review along with your updated medication list.  Flu today Rx for zostavax No PSA due to age UTD otherwise

## 2012-10-11 NOTE — Assessment & Plan Note (Signed)
Doing okay with the medicine No changes needed

## 2012-10-15 ENCOUNTER — Encounter: Payer: Self-pay | Admitting: *Deleted

## 2012-12-11 DIAGNOSIS — F0280 Dementia in other diseases classified elsewhere without behavioral disturbance: Secondary | ICD-10-CM

## 2012-12-11 HISTORY — DX: Dementia in other diseases classified elsewhere, unspecified severity, without behavioral disturbance, psychotic disturbance, mood disturbance, and anxiety: F02.80

## 2013-04-06 ENCOUNTER — Other Ambulatory Visit: Payer: Self-pay | Admitting: Internal Medicine

## 2013-04-09 ENCOUNTER — Ambulatory Visit: Payer: No Typology Code available for payment source | Admitting: Internal Medicine

## 2013-04-11 ENCOUNTER — Ambulatory Visit: Payer: No Typology Code available for payment source | Admitting: Internal Medicine

## 2013-04-21 ENCOUNTER — Ambulatory Visit: Payer: No Typology Code available for payment source | Admitting: Internal Medicine

## 2013-04-28 ENCOUNTER — Encounter: Payer: Self-pay | Admitting: Internal Medicine

## 2013-04-28 ENCOUNTER — Ambulatory Visit (INDEPENDENT_AMBULATORY_CARE_PROVIDER_SITE_OTHER): Payer: Medicare Other | Admitting: Internal Medicine

## 2013-04-28 VITALS — BP 112/70 | HR 62 | Temp 98.0°F | Wt 179.0 lb

## 2013-04-28 DIAGNOSIS — N400 Enlarged prostate without lower urinary tract symptoms: Secondary | ICD-10-CM

## 2013-04-28 DIAGNOSIS — F0281 Dementia in other diseases classified elsewhere with behavioral disturbance: Secondary | ICD-10-CM | POA: Insufficient documentation

## 2013-04-28 DIAGNOSIS — J309 Allergic rhinitis, unspecified: Secondary | ICD-10-CM

## 2013-04-28 DIAGNOSIS — F0280 Dementia in other diseases classified elsewhere without behavioral disturbance: Secondary | ICD-10-CM

## 2013-04-28 DIAGNOSIS — G309 Alzheimer's disease, unspecified: Secondary | ICD-10-CM | POA: Insufficient documentation

## 2013-04-28 NOTE — Assessment & Plan Note (Signed)
Voiding fine No changes needed

## 2013-04-28 NOTE — Progress Notes (Signed)
Subjective:    Patient ID: Nicholas Kennedy, male    DOB: [birth year], 72 y.o.   MRN: [id number]  HPI Here with girlfriend She feels that he is still not remembering meds all the time---he says everything is okay Still takes out of the bottle They see each other inconsistently so it would be hard for her to take control  She notes some decline He comes to her house (for overnight)---doesn't bring meds or toiletries He forgets bills and papers for accountant Can't keep up with checkbook Repeating himself more Still drives, buys prepared foods or eats sandwiches (or take out) Does okay with laundry  Voids okay No incontinence Intermittent nocturia No daytime problems  Current Outpatient Prescriptions on File Prior to Visit  Medication Sig Dispense Refill  . donepezil (ARICEPT) 10 MG tablet TAKE 1 TABLET BY MOUTH EVERY DAY  30 tablet  7  . doxazosin (CARDURA) 4 MG tablet Take 1 tablet (4 mg total) by mouth at bedtime.  30 tablet  11  . VIAGRA 100 MG tablet TAKE 1/2 TO 1 TABLET ABOUT 30-60 MINUTES BEFORE SEX  6 tablet  3   No current facility-administered medications on file prior to visit.    Allergies  Allergen Reactions  . Penicillins     Past Medical History  Diagnosis Date  . Allergy   . Asthma   . Diverticulitis   . Hyperlipidemia   . Peyronie's disease   . ED (erectile dysfunction)   . BPH (benign prostatic hypertrophy)   . History of nephrolithiasis   . Mild cognitive impairment     Past Surgical History  Procedure Laterality Date  . Tonsillectomy      Family History  Problem Relation Age of Onset  . Alzheimer's disease Mother   . Alcohol abuse Father   . Cancer Father     prostate cancer  . Coronary artery disease Neg Hx   . Hypertension Neg Hx   . Diabetes Neg Hx     History   Social History  . Marital Status: Married    Spouse Name: N/A    Number of Children: 3  . Years of Education: N/A   Occupational History  . retired Naval architect    . farm beef cattle    Social History Main Topics  . Smoking status: Former Games developer  . Smokeless tobacco: Never Used  . Alcohol Use: No  . Drug Use: No  . Sexually Active: Not on file   Other Topics Concern  . Not on file   Social History Narrative   No living will   Girlfriend Madaline Savage or son, Tawanna Cooler has health care POA   Would accept resuscitation   Hasn't thought about feeding tube   Review of Systems Daughter lives in [address]---but planning to move and live her soon. Can help then Sleeps well Generally stays satisifed---no depression     Objective:   Physical Exam  Constitutional: He appears well-developed and well-nourished. No distress.  Neck: Normal range of motion. Neck supple.  Cardiovascular: Normal rate, regular rhythm and normal heart sounds.  Exam reveals no gallop.   No murmur heard. Pulmonary/Chest: Effort normal and breath sounds normal. No respiratory distress. He has no wheezes. He has no rales.  Abdominal: Soft. There is no tenderness.  Musculoskeletal: He exhibits no edema and no tenderness.  Lymphadenopathy:    He has no cervical adenopathy.  Neurological:  Limited insight Judgement appears impaired  Psychiatric: He has a normal mood and  affect. His behavior is normal.  Good spirits          Assessment & Plan:

## 2013-04-28 NOTE — Assessment & Plan Note (Signed)
Milder than in past Not using meds now

## 2013-04-28 NOTE — Assessment & Plan Note (Addendum)
Now seems to fit criteria for mild Alzheimer's Decline in executive function in addition to memory Impaired judgement and insight Needs more help---hopefully daughter will be moving here soon (if not, may need to hire aides)  No serious affective problems at present Just some frustration

## 2013-11-10 ENCOUNTER — Encounter: Payer: Medicare Other | Admitting: Internal Medicine

## 2013-11-10 DIAGNOSIS — Z0289 Encounter for other administrative examinations: Secondary | ICD-10-CM

## 2013-12-01 ENCOUNTER — Other Ambulatory Visit: Payer: Self-pay | Admitting: Internal Medicine

## 2013-12-16 ENCOUNTER — Other Ambulatory Visit: Payer: Self-pay | Admitting: Internal Medicine

## 2014-01-13 ENCOUNTER — Other Ambulatory Visit: Payer: Self-pay | Admitting: Internal Medicine

## 2014-01-14 ENCOUNTER — Telehealth: Payer: Self-pay

## 2014-01-14 NOTE — Telephone Encounter (Signed)
Delice Bisonara pts daughter said pt is having more problem with memory and dementia; Delice Bisonara wants to know if pt should have change of medication or should pt see a specialist. Delice Bisonara is not sure if pt is presently remembering to take his med. Delice Bisonara plans to come with pt on 01/28/14 appt but was not sure if should wait that long to let Dr Carola RhineLetak know pt's short term memory has significantly deteriorated in last 3 months.[facility] [place] [place] [facility].

## 2014-01-14 NOTE — Telephone Encounter (Signed)
This does not sound acute although I completely understand her concern.  This can wait for Dr. Karle StarchLetvak's return on Monday and would be best for him to manage since he is his PCP.

## 2014-01-15 NOTE — Telephone Encounter (Signed)
.  left message to have patient's daughter return my call.  

## 2014-01-22 NOTE — Telephone Encounter (Signed)
.  left message to have patient's daughter return my call.  

## 2014-01-28 ENCOUNTER — Encounter: Payer: Medicare Other | Admitting: Internal Medicine

## 2014-02-09 ENCOUNTER — Encounter: Payer: Self-pay | Admitting: Internal Medicine

## 2014-02-09 ENCOUNTER — Ambulatory Visit (INDEPENDENT_AMBULATORY_CARE_PROVIDER_SITE_OTHER): Payer: Medicare Other | Admitting: Internal Medicine

## 2014-02-09 VITALS — BP 140/80 | HR 61 | Temp 98.5°F | Ht 67.0 in | Wt 187.0 lb

## 2014-02-09 DIAGNOSIS — N400 Enlarged prostate without lower urinary tract symptoms: Secondary | ICD-10-CM

## 2014-02-09 DIAGNOSIS — Z0000 Encounter for general adult medical examination without abnormal findings: Secondary | ICD-10-CM

## 2014-02-09 DIAGNOSIS — G309 Alzheimer's disease, unspecified: Secondary | ICD-10-CM

## 2014-02-09 DIAGNOSIS — J309 Allergic rhinitis, unspecified: Secondary | ICD-10-CM

## 2014-02-09 DIAGNOSIS — F0280 Dementia in other diseases classified elsewhere without behavioral disturbance: Secondary | ICD-10-CM

## 2014-02-09 DIAGNOSIS — E785 Hyperlipidemia, unspecified: Secondary | ICD-10-CM

## 2014-02-09 LAB — COMPREHENSIVE METABOLIC PANEL
ALT: 27 U/L (ref 0–53)
AST: 23 U/L (ref 0–37)
Albumin: 4.2 g/dL (ref 3.5–5.2)
Alkaline Phosphatase: 67 U/L (ref 39–117)
BUN: 18 mg/dL (ref 6–23)
CALCIUM: 9 mg/dL (ref 8.4–10.5)
CHLORIDE: 106 meq/L (ref 96–112)
CO2: 27 meq/L (ref 19–32)
Creatinine, Ser: 0.9 mg/dL (ref 0.4–1.5)
GFR: 85.76 mL/min (ref >60.00)
Glucose, Bld: 97 mg/dL (ref 70–99)
Potassium: 3.7 mEq/L (ref 3.5–5.1)
SODIUM: 140 meq/L (ref 135–145)
Total Bilirubin: 0.9 mg/dL (ref 0.3–1.2)
Total Protein: 7.2 g/dL (ref 6.0–8.3)

## 2014-02-09 LAB — LIPID PANEL
Cholesterol: 237 mg/dL — ABNORMAL HIGH (ref 0–200)
HDL: 52.1 mg/dL (ref >39.00)
LDL Cholesterol: 166 mg/dL — ABNORMAL HIGH (ref 0–99)
Total CHOL/HDL Ratio: 5
Triglycerides: 93 mg/dL (ref 0.0–149.0)
VLDL: 18.6 mg/dL (ref 0.0–40.0)

## 2014-02-09 LAB — CBC WITH DIFFERENTIAL/PLATELET
Basophils Absolute: 0 10*3/uL (ref 0.0–0.1)
Basophils Relative: 0.5 % (ref 0.0–3.0)
EOS ABS: 0.1 10*3/uL (ref 0.0–0.7)
Eosinophils Relative: 1.1 % (ref 0.0–5.0)
HCT: 43.2 % (ref 39.0–52.0)
Hemoglobin: 14.3 g/dL (ref 13.0–17.0)
LYMPHS PCT: 12.5 % (ref 12.0–46.0)
Lymphs Abs: 0.9 10*3/uL (ref 0.7–4.0)
MCHC: 33.1 g/dL (ref 30.0–36.0)
MCV: 93.6 fl (ref 78.0–100.0)
MONOS PCT: 6.7 % (ref 3.0–12.0)
Monocytes Absolute: 0.5 10*3/uL (ref 0.1–1.0)
Neutro Abs: 5.6 10*3/uL (ref 1.4–7.7)
Neutrophils Relative %: 79.2 % — ABNORMAL HIGH (ref 43.0–77.0)
PLATELETS: 206 10*3/uL (ref 150.0–400.0)
RBC: 4.62 Mil/uL (ref 4.22–5.81)
RDW: 14.9 % — ABNORMAL HIGH (ref 11.5–14.6)
WBC: 7.1 10*3/uL (ref 4.5–10.5)

## 2014-02-09 LAB — T4, FREE: FREE T4: 0.95 ng/dL (ref 0.60–1.60)

## 2014-02-09 LAB — TSH: TSH: 2.93 u[IU]/mL (ref 0.35–5.50)

## 2014-02-09 MED ORDER — ZOSTER VACCINE LIVE 19400 UNT/0.65ML ~~LOC~~ SOLR: 0.6500 mL | Freq: Once | SUBCUTANEOUS | Status: DC

## 2014-02-09 NOTE — Patient Instructions (Addendum)
Please call Zerita BoersHeather McKay for an assessment. She is a dementia specialist with [facility]. There is no charge for this. The number is [phone]. You need to stop driving---I have asked your daughter to take away your keys>

## 2014-02-09 NOTE — Assessment & Plan Note (Signed)
Slowly progressive Should not be driving Referred daughter to Zerita BoersHeather McKay Some apraxia, further decline in executive function, insight and judgement

## 2014-02-09 NOTE — Assessment & Plan Note (Signed)
Healthy other than the dementia Rx for zostavax UTD on colon  No PSA due to age

## 2014-02-09 NOTE — Assessment & Plan Note (Signed)
Voiding okay on doxazosin
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
## 2014-02-09 NOTE — Assessment & Plan Note (Signed)
Does okay with OTC meds in season

## 2014-02-09 NOTE — Assessment & Plan Note (Signed)
Rx not appropriate given dementia (Alzheimers)

## 2014-02-09 NOTE — Progress Notes (Signed)
Pre visit review using our clinic review tool, if applicable. No additional management support is needed unless otherwise documented below in the visit note. 

## 2014-02-11 ENCOUNTER — Encounter: Payer: Self-pay | Admitting: *Deleted

## 2014-02-23 ENCOUNTER — Telehealth: Payer: Self-pay

## 2014-02-23 NOTE — Telephone Encounter (Signed)
She should report her concerns to Motor Vehicles---they can revoke his license if needed. Unfortunately, that will not stop many people since he denies the memory problems. Often, an intervention to take away keys and car is needed (but certainly not easy)

## 2014-02-23 NOTE — Telephone Encounter (Signed)
Nicholas Kennedy pts daughter said pt was advised by Dr Alphonsus SiasLetvak at recent office visit to stop driving. Nicholas Kennedy said pt is reluctant to stop driving and Nicholas Kennedy wants to know what is next step to have pt stop driving. Tara request cb [phone].

## 2014-02-25 NOTE — Telephone Encounter (Signed)
Tara left v/m requesting cb about pt driving.

## 2014-02-25 NOTE — Telephone Encounter (Signed)
Spoke with Delice Bisonara and advised results, they do have an intervention set up for next weekend. They also might want an OV to discuss with Dr. Alphonsus SiasLetvak that pt can't make decisions for himself, could he be declared mentality incompetent?

## 2014-02-26 NOTE — Telephone Encounter (Signed)
Tara left v/m; [facility] request for pt to stop driving; the form requires doctors signature and Delice Bisonara wants to know if Dr Alphonsus SiasLetvak will sign medical request form.Please advise.

## 2014-02-26 NOTE — Telephone Encounter (Signed)
Yes , I will

## 2014-02-26 NOTE — Telephone Encounter (Signed)
Mental competence is actually a legal issue. I certainly could attest that I think he is limited in decision making at this point and needs financial assistance

## 2014-02-26 NOTE — Telephone Encounter (Signed)
Spoke with daughter and advised results.  

## 2014-03-16 ENCOUNTER — Telehealth: Payer: Self-pay

## 2014-03-16 DIAGNOSIS — G309 Alzheimer's disease, unspecified: Principal | ICD-10-CM

## 2014-03-16 DIAGNOSIS — F0280 Dementia in other diseases classified elsewhere without behavioral disturbance: Secondary | ICD-10-CM

## 2014-03-16 NOTE — Telephone Encounter (Signed)
Delice Bisonara pts daughter left v/m; Delice Bisonara spoke with pts ins co and Delice Bisonara is requesting order for in home care to assist pt with cooking and cleaning in the home a couple of hours daily due to pts dementia. Tara request cb.

## 2014-03-20 ENCOUNTER — Telehealth: Payer: Self-pay | Admitting: Internal Medicine

## 2014-03-20 NOTE — Telephone Encounter (Signed)
I called the patients daughter Delice Bisonara and had to leave a VM for her. Patients insurance will not cover a HH Aide to do any cooking or cleaning whatsoever. I dont know where she got her information from and I asked her to call me back to discuss. The only [facility] is justifed is when the patient is receiving a skilled nursing need and then they would help with bathing but that is all. Is there a skilled nursing need for this patient? I will await the daughters call to discuss, maybe there is a program for Dementia patients? His UHC plan is difficult to even find an agency to accept.

## 2014-03-20 NOTE — Telephone Encounter (Signed)
I was skeptical as well. The only insurances that would cover this are long term care policies He does not have a skilled need at this time

## 2014-04-03 NOTE — Telephone Encounter (Signed)
Left detailed message on VM with results, advised daughter to return my call.
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
## 2014-04-03 NOTE — Telephone Encounter (Signed)
Mental incompetence is a legal matter--not a medical one. If they have tried to take away the car already, they may need to call the police to help make sure he doesn't drive (can facilitate removing the vehicle probably) Incompetence is decided by a judge but I am not sure how that process goes

## 2014-05-08 ENCOUNTER — Encounter: Payer: Medicare Other | Admitting: Internal Medicine

## 2014-06-01 ENCOUNTER — Telehealth: Payer: Self-pay | Admitting: Internal Medicine

## 2014-06-01 NOTE — Telephone Encounter (Signed)
Pt's daughter, Delice Bisonara, dropped off [facility], that pt needs filled out. Forms placed on Dee's desk to give to Dr Alphonsus SiasLetvak. Please call pt's daughter, Delice Bisonara, at [phone]., when forms are ready to be picked up. Thank you!

## 2014-06-01 NOTE — Telephone Encounter (Signed)
Form on your desk  

## 2014-06-02 NOTE — Telephone Encounter (Signed)
Form done Did he have PPD?

## 2014-06-02 NOTE — Telephone Encounter (Signed)
No he hasn't had a ppd, spoke with daughter and she was asking if there is a medication to give patient to take the "edge off"? Per daughter pt like to go and go but gets real anxious when he can't and when he goes somewhere he get nervous. Please advise

## 2014-06-03 NOTE — Telephone Encounter (Signed)
He should probably come back in and we can discuss it. It would help if she could come, or I can discuss it with his girlfriend. Anti anxiety meds may help --but all meds have potential for side effects

## 2014-06-03 NOTE — Telephone Encounter (Signed)
Spoke with daughter and advised results. appt scheduled

## 2014-06-05 ENCOUNTER — Ambulatory Visit: Payer: Medicare Other | Admitting: Internal Medicine

## 2014-06-08 ENCOUNTER — Ambulatory Visit (INDEPENDENT_AMBULATORY_CARE_PROVIDER_SITE_OTHER): Payer: Medicare Other | Admitting: Internal Medicine

## 2014-06-08 ENCOUNTER — Encounter: Payer: Self-pay | Admitting: Internal Medicine

## 2014-06-08 VITALS — BP 150/90 | HR 74 | Temp 98.0°F | Wt 185.0 lb

## 2014-06-08 DIAGNOSIS — Z201 Contact with and (suspected) exposure to tuberculosis: Secondary | ICD-10-CM

## 2014-06-08 DIAGNOSIS — F0280 Dementia in other diseases classified elsewhere without behavioral disturbance: Secondary | ICD-10-CM

## 2014-06-08 DIAGNOSIS — G309 Alzheimer's disease, unspecified: Principal | ICD-10-CM

## 2014-06-08 DIAGNOSIS — Z111 Encounter for screening for respiratory tuberculosis: Secondary | ICD-10-CM

## 2014-06-08 DIAGNOSIS — F0281 Dementia in other diseases classified elsewhere with behavioral disturbance: Secondary | ICD-10-CM

## 2014-06-08 DIAGNOSIS — F02818 Dementia in other diseases classified elsewhere, unspecified severity, with other behavioral disturbance: Secondary | ICD-10-CM

## 2014-06-08 MED ORDER — SERTRALINE HCL 50 MG PO TABS: 50.0000 mg | ORAL_TABLET | Freq: Every day | ORAL | Status: DC

## 2014-06-08 NOTE — Addendum Note (Signed)
Addended by: Sueanne MargaritaSMITH, DESHANNON L on: 06/08/2014 05:12 PM   Modules accepted: Orders

## 2014-06-08 NOTE — Patient Instructions (Signed)
Please start the sertraline with 1/2 tab for 4 days. If no problems after 4 days, please increase to a full tab daily then.

## 2014-06-08 NOTE — Assessment & Plan Note (Signed)
Mostly mild anxiety at this point Will try low dose sertraline [facility]--and family now arranging all day supervision for him at home

## 2014-06-08 NOTE — Progress Notes (Signed)
   Subjective:    Patient ID: Nicholas Kennedy, male    DOB: 08/30/1941, 73 y.o.   MRN: [id number]  HPI Here with daughter and 2 sons  [facility] Fights going there and is anxious to leave But seems to be doing okay there--- at least till lunch Gets very anxious then Also anxious at home --- will walk back and forth to daughter's home constantly (1/4 mile) They describe psychomotor agitation Gets angry and agitated if he doesn't get his way  Still on his own at home Now will be starting with someone with him all day to monitor his safety (often daughter) [place] [address]  Generally continent bowel and bladder Independent with bathing and dressing and other personal care  Current Outpatient Prescriptions on File Prior to Visit  Medication Sig Dispense Refill  . doxazosin (CARDURA) 4 MG tablet TAKE 1 TABLET BY MOUTH AT BEDTIME  30 tablet  2   No current facility-administered medications on file prior to visit.    Allergies  Allergen Reactions  . Penicillins     Past Medical History  Diagnosis Date  . Allergy   . Asthma   . Diverticulitis   . Hyperlipidemia   . Peyronie's disease   . ED (erectile dysfunction)   . BPH (benign prostatic hypertrophy)   . History of nephrolithiasis   . Alzheimer's dementia 2014    Past Surgical History  Procedure Laterality Date  . Tonsillectomy      Family History  Problem Relation Age of Onset  . Alzheimer's disease Mother   . Alcohol abuse Father   . Cancer Father     prostate cancer  . Coronary artery disease Neg Hx   . Hypertension Neg Hx   . Diabetes Neg Hx     History   Social History  . Marital Status: Married    Spouse Name: N/A    Number of Children: 3  . Years of Education: N/A   Occupational History  . retired Naval architectpurchasing agent   . farm beef cattle    Social History Main Topics  . Smoking status: Former Games developermoker  . Smokeless tobacco: Never Used  . Alcohol  Use: No  . Drug Use: No  . Sexual Activity: Not on file   Other Topics Concern  . Not on file   Social History Narrative   No living will   Girlfriend Madaline SavageGlenda Fortune, daughter Leola Brazilara Pitt, or son [name] has health care POA   Would accept resuscitation   Hasn't thought about feeding tube   Review of Systems Sleeps okay at night Not really depressed Appetite is fine    Objective:   Physical Exam  Constitutional: He appears well-developed and well-nourished. No distress.  Neurological:  Lacks insight and almost no short term memory  Psychiatric: He has a normal mood and affect. His behavior is normal.          Assessment & Plan:

## 2014-06-08 NOTE — Progress Notes (Signed)
Pre visit review using our clinic review tool, if applicable. No additional management support is needed unless otherwise documented below in the visit note. 

## 2014-06-10 LAB — TB SKIN TEST
Induration: 0 mm
TB SKIN TEST: NEGATIVE

## 2014-07-06 ENCOUNTER — Telehealth: Payer: Self-pay

## 2014-07-06 NOTE — Telephone Encounter (Signed)
Nicholas Kennedy pts daughter and POA left v/m requesting a letter to the bank to move some of pt's finances stating pt has dementia and is no longer making financial decisions.Tara request cb when ready for pick up.

## 2014-07-07 ENCOUNTER — Encounter: Payer: Self-pay | Admitting: Internal Medicine

## 2014-07-07 DIAGNOSIS — Z0279 Encounter for issue of other medical certificate: Secondary | ICD-10-CM

## 2014-07-07 NOTE — Telephone Encounter (Signed)
Letter done $20 charge 

## 2014-07-07 NOTE — Telephone Encounter (Signed)
I spoke with Nicholas Kennedy and informed her letter is ready to be picked up and of $20 charge.  Patient has appointment tomorrow and they'll pick up letter then.

## 2014-07-08 ENCOUNTER — Ambulatory Visit (INDEPENDENT_AMBULATORY_CARE_PROVIDER_SITE_OTHER): Payer: Medicare Other | Admitting: Internal Medicine

## 2014-07-08 ENCOUNTER — Encounter: Payer: Self-pay | Admitting: Internal Medicine

## 2014-07-08 VITALS — BP 138/80 | HR 86 | Temp 98.3°F | Wt 182.0 lb

## 2014-07-08 DIAGNOSIS — F0281 Dementia in other diseases classified elsewhere with behavioral disturbance: Secondary | ICD-10-CM

## 2014-07-08 DIAGNOSIS — R251 Tremor, unspecified: Secondary | ICD-10-CM

## 2014-07-08 DIAGNOSIS — F0280 Dementia in other diseases classified elsewhere without behavioral disturbance: Secondary | ICD-10-CM

## 2014-07-08 DIAGNOSIS — G309 Alzheimer's disease, unspecified: Principal | ICD-10-CM

## 2014-07-08 DIAGNOSIS — R259 Unspecified abnormal involuntary movements: Secondary | ICD-10-CM

## 2014-07-08 DIAGNOSIS — F02818 Dementia in other diseases classified elsewhere, unspecified severity, with other behavioral disturbance: Secondary | ICD-10-CM

## 2014-07-08 NOTE — Assessment & Plan Note (Signed)
Doing much better  Still mild resistance to leaving anywhere but not pacing along road and [facility]

## 2014-07-08 NOTE — Assessment & Plan Note (Signed)
Goes back for some time but more noticeable lately Normal gait, no bradykinesia, normal handwriting, no bradykinesia Father had tremor  Probably familial tremor Clinical picture is not of Lewy body dementia Not Parkinson's Doesn't need meds

## 2014-07-08 NOTE — Progress Notes (Signed)
Pre visit review using our clinic review tool, if applicable. No additional management support is needed unless otherwise documented below in the visit note. 

## 2014-07-08 NOTE — Progress Notes (Signed)
   Subjective:    Patient ID: Timoteo ExposeBilly T Felmlee, male    DOB: 01/28/1941, 73 y.o.   MRN: [id number]  HPI Here with daughter  Now has aide for all day hours and evening. Does sleep without someone there Not as restless--no elopement issues, not pacing as much  [place]now [place] a [place] Still protests going but enjoys it and stays the whole time  Aide prepares meals Appetite was off for a while--now better again Continues to do personal care--- no assist for ADLs Remains continent  Having some tremor which is more noticeable in past month No affect on ADLs Doesn't really bother him Current Outpatient Prescriptions on File Prior to Visit  Medication Sig Dispense Refill  . doxazosin (CARDURA) 4 MG tablet TAKE 1 TABLET BY MOUTH AT BEDTIME  30 tablet  2  . sertraline (ZOLOFT) 50 MG tablet Take 1 tablet (50 mg total) by mouth daily.  30 tablet  3   No current facility-administered medications on file prior to visit.    Allergies  Allergen Reactions  . Penicillins     Past Medical History  Diagnosis Date  . Allergy   . Asthma   . Diverticulitis   . Hyperlipidemia   . Peyronie's disease   . ED (erectile dysfunction)   . BPH (benign prostatic hypertrophy)   . History of nephrolithiasis   . Alzheimer's dementia 2014    Past Surgical History  Procedure Laterality Date  . Tonsillectomy      Family History  Problem Relation Age of Onset  . Alzheimer's disease Mother   . Alcohol abuse Father   . Cancer Father     prostate cancer  . Coronary artery disease Neg Hx   . Hypertension Neg Hx   . Diabetes Neg Hx     History   Social History  . Marital Status: Married    Spouse Name: N/A    Number of Children: 3  . Years of Education: N/A   Occupational History  . retired Naval architectpurchasing agent   . farm beef cattle    Social History Main Topics  . Smoking status: Former Games developermoker  . Smokeless tobacco: Never Used  . Alcohol Use: No  . Drug Use: No  . Sexual  Activity: Not on file   Other Topics Concern  . Not on file   Social History Narrative   No living will   Girlfriend Madaline SavageGlenda Fortune, daughter Leola Brazilara Pitt, or son [name] has health care POA   Would accept resuscitation   Hasn't thought about feeding tube   Review of Systems Weight is down 3# Sleeps well     Objective:   Physical Exam  Constitutional: He appears well-developed and well-nourished. No distress.  Neurological:  Fine resting tremor in hands Normal gait and handwriting No bradykinesia  Psychiatric: He has a normal mood and affect. His behavior is normal.          Assessment & Plan:

## 2014-07-27 ENCOUNTER — Telehealth: Payer: Self-pay | Admitting: Internal Medicine

## 2014-07-27 NOTE — Telephone Encounter (Signed)
Will evaluate tomorrow 15 minutes is enough---you can make the other half a same day

## 2014-07-27 NOTE — Telephone Encounter (Signed)
Patient Information:  Caller Name: Delice Bisonara  Phone: [phone]  Patient: Nicholas Kennedy, Nicholas Kennedy  Gender: Male  DOB: 04/01/1941  Age: [age] Years  PCP: Tillman AbideLetvak , Richard [facility](Family Practice)  Office Follow Up:  Does the office need to follow up with this patient?: No  Instructions For The Office: N/A  RN Note:  Daughter states patient has history of Dementia and agitation. States patient was prescribed Zoloft 50mg . daily 06/08/14 with improvement of agitation. Daughter states patient became agitated at approx 1130 07/27/14 while [facility] and was pounding on the door. Daughter states she had to go and get patient from the Day Program because he would not calm down. Daughter states patient became calm when Daughter arrived and has remained calm since. Patient is afebrile. Urinating normally for patient. Daughter states patient has had gradual increase in confusion. No slurred speech. Patient is ambulatory and moving all extremities well. Afebrile. Daughter states patient is calm and cooperative at present.  30 minute appt. scheduled (Nursing judgement). Care advice given per guidelines. Call back parameters reviewed. Daughter verbalizes understanding.  Symptoms  Reason For Call & Symptoms: Increased agitation, shakey  Reviewed Health History In EMR: Yes  Reviewed Medications In EMR: Yes  Reviewed Allergies In EMR: Yes  Reviewed Surgeries / Procedures: Yes  Date of Onset of Symptoms: 07/27/2014  Guideline(s) Used:  Confusion - Delirium  Disposition Per Guideline:   See Today or Tomorrow in Office  Reason For Disposition Reached:   Longstanding confusion (e.g., dementia, stroke) and worsening  Advice Given:  Call Back If:  You (i.e., patient, family member) become worse.  Patient Will Follow Care Advice:  YES  Appointment Scheduled:  07/28/2014 09:00:00 Appointment Scheduled Provider:  Tillman AbideLetvak , [facility](Family Practice)

## 2014-07-28 ENCOUNTER — Ambulatory Visit (INDEPENDENT_AMBULATORY_CARE_PROVIDER_SITE_OTHER): Payer: Medicare Other | Admitting: Internal Medicine

## 2014-07-28 ENCOUNTER — Encounter: Payer: Self-pay | Admitting: Internal Medicine

## 2014-07-28 ENCOUNTER — Encounter (INDEPENDENT_AMBULATORY_CARE_PROVIDER_SITE_OTHER): Payer: Self-pay

## 2014-07-28 VITALS — BP 140/88 | HR 85 | Temp 98.1°F | Wt 183.0 lb

## 2014-07-28 DIAGNOSIS — F0281 Dementia in other diseases classified elsewhere with behavioral disturbance: Secondary | ICD-10-CM

## 2014-07-28 DIAGNOSIS — G309 Alzheimer's disease, unspecified: Principal | ICD-10-CM

## 2014-07-28 DIAGNOSIS — F02818 Dementia in other diseases classified elsewhere, unspecified severity, with other behavioral disturbance: Secondary | ICD-10-CM

## 2014-07-28 DIAGNOSIS — F0280 Dementia in other diseases classified elsewhere without behavioral disturbance: Secondary | ICD-10-CM

## 2014-07-28 MED ORDER — ALPRAZOLAM 0.25 MG PO TABS: 0.2500 mg | ORAL_TABLET | Freq: Two times a day (BID) | ORAL | Status: DC | PRN

## 2014-07-28 NOTE — Progress Notes (Signed)
Pre visit review using our clinic review tool, if applicable. No additional management support is needed unless otherwise documented below in the visit note. 

## 2014-07-28 NOTE — Assessment & Plan Note (Signed)
May have some lethargy on the sertraline---but not walking out on road or getting frustrated as much Had spell yesterday of agitation--no obvious precipitating factor Will order alprazolam for prn use

## 2014-07-28 NOTE — Progress Notes (Signed)
   Subjective:    Patient ID: Nicholas Kennedy, male    DOB: 09/09/1941, 73 y.o.   MRN: [id number]  HPI Here with daughter Seems to have been more lethargic since starting the sertraline Taking at night Sleeping more (nap)--doesn't want to do things Caregivers have been concerned about this  [place]urg[facility] Wanted to leave and got very agitated Banging on the door and wouldn't calm down Daughter had to go and pick him up Then he calmed down  Seems like his shakiness is worse May be related to the agitation--[facility]  Current Outpatient Prescriptions on File Prior to Visit  Medication Sig Dispense Refill  . doxazosin (CARDURA) 4 MG tablet TAKE 1 TABLET BY MOUTH AT BEDTIME  30 tablet  2  . sertraline (ZOLOFT) 50 MG tablet Take 1 tablet (50 mg total) by mouth daily.  30 tablet  3   No current facility-administered medications on file prior to visit.    Allergies  Allergen Reactions  . Penicillins     Past Medical History  Diagnosis Date  . Allergy   . Asthma   . Diverticulitis   . Hyperlipidemia   . Peyronie's disease   . ED (erectile dysfunction)   . BPH (benign prostatic hypertrophy)   . History of nephrolithiasis   . Alzheimer's dementia 2014    Past Surgical History  Procedure Laterality Date  . Tonsillectomy      Family History  Problem Relation Age of Onset  . Alzheimer's disease Mother   . Alcohol abuse Father   . Cancer Father     prostate cancer  . Coronary artery disease Neg Hx   . Hypertension Neg Hx   . Diabetes Neg Hx     History   Social History  . Marital Status: Married    Spouse Name: N/A    Number of Children: 3  . Years of Education: N/A   Occupational History  . retired Naval architectpurchasing agent   . farm beef cattle    Social History Main Topics  . Smoking status: Former Games developermoker  . Smokeless tobacco: Never Used  . Alcohol Use: No  . Drug Use: No  . Sexual Activity: Not on file   Other Topics Concern  . Not on file     Social History Narrative   No living will   Girlfriend Madaline SavageGlenda Fortune, daughter Leola Brazilara Pitt, or son [name] has health care POA   Would accept resuscitation   Hasn't thought about feeding tube   Review of Systems Appetite has been better since on sertraline Sleeping well     Objective:   Physical Exam  Constitutional: He appears well-developed and well-nourished. No distress.  Psychiatric: He has a normal mood and affect. His behavior is normal.  Calm Does answer questions appropriately          Assessment & Plan:

## 2014-07-29 ENCOUNTER — Encounter: Payer: Self-pay | Admitting: Internal Medicine

## 2014-08-21 ENCOUNTER — Ambulatory Visit: Payer: Medicare Other | Admitting: Internal Medicine

## 2014-09-26 ENCOUNTER — Other Ambulatory Visit: Payer: Self-pay | Admitting: Internal Medicine

## 2014-10-28 ENCOUNTER — Other Ambulatory Visit: Payer: Self-pay | Admitting: Internal Medicine

## 2014-11-09 ENCOUNTER — Ambulatory Visit (INDEPENDENT_AMBULATORY_CARE_PROVIDER_SITE_OTHER): Payer: Medicare Other | Admitting: Internal Medicine

## 2014-11-09 ENCOUNTER — Encounter: Payer: Self-pay | Admitting: Internal Medicine

## 2014-11-09 VITALS — BP 144/74 | HR 88 | Temp 98.2°F | Wt 191.0 lb

## 2014-11-09 DIAGNOSIS — G309 Alzheimer's disease, unspecified: Principal | ICD-10-CM

## 2014-11-09 DIAGNOSIS — R251 Tremor, unspecified: Secondary | ICD-10-CM

## 2014-11-09 DIAGNOSIS — F0281 Dementia in other diseases classified elsewhere with behavioral disturbance: Secondary | ICD-10-CM

## 2014-11-09 DIAGNOSIS — F02818 Dementia in other diseases classified elsewhere, unspecified severity, with other behavioral disturbance: Secondary | ICD-10-CM

## 2014-11-09 DIAGNOSIS — N400 Enlarged prostate without lower urinary tract symptoms: Secondary | ICD-10-CM

## 2014-11-09 DIAGNOSIS — F39 Unspecified mood [affective] disorder: Secondary | ICD-10-CM

## 2014-11-09 DIAGNOSIS — G308 Other Alzheimer's disease: Secondary | ICD-10-CM

## 2014-11-09 MED ORDER — ALPRAZOLAM 0.25 MG PO TABS: 0.2500 mg | ORAL_TABLET | Freq: Two times a day (BID) | ORAL | Status: DC | PRN

## 2014-11-09 NOTE — Assessment & Plan Note (Signed)
Fairly stable Doing okay with daytime caregivers and twice a week adult day care Discussed with daughter--they will look for assisted living if he can't stay alone at night No meds for this other than occasional alprazolam

## 2014-11-09 NOTE — Progress Notes (Signed)
Pre visit review using our clinic review tool, if applicable. No additional management support is needed unless otherwise documented below in the visit note. 

## 2014-11-09 NOTE — Assessment & Plan Note (Signed)
Related to dementia He has done better since on sertraline

## 2014-11-09 NOTE — Assessment & Plan Note (Signed)
Better now May have been related to agitation---which is better since [facility]

## 2014-11-09 NOTE — Progress Notes (Signed)
   Subjective:    Patient ID: Nicholas Kennedy, male    DOB: 10/19/1941, 73 y.o.   MRN: [id number]  HPI Here with daughter  [place] end of [place] road Full time care other than night No elopement attempts Meals on wheels for lunch--caregivers fix meal for dinner. He will rarely fix a sandwich Helps with laundry but family otherwise do it  [place] going [place] [facility] twice a week Using the lorazepam only once in a while--if he doesn't seem relaxed going in there He likes it now  Mood still good Calm on the sertraline and overall doing better  Current Outpatient Prescriptions on File Prior to Visit  Medication Sig Dispense Refill  . ALPRAZolam (XANAX) 0.25 MG tablet Take 1 tablet (0.25 mg total) by mouth 2 (two) times daily as needed for anxiety. 30 tablet 0  . doxazosin (CARDURA) 4 MG tablet TAKE 1 TABLET BY MOUTH AT BEDTIME 30 tablet 2  . sertraline (ZOLOFT) 50 MG tablet TAKE 1 TABLET (50 MG TOTAL) BY MOUTH DAILY. 30 tablet 3   No current facility-administered medications on file prior to visit.    Allergies  Allergen Reactions  . Penicillins     Past Medical History  Diagnosis Date  . Allergy   . Asthma   . Diverticulitis   . Hyperlipidemia   . Peyronie's disease   . ED (erectile dysfunction)   . BPH (benign prostatic hypertrophy)   . History of nephrolithiasis   . Alzheimer's dementia 2014    Past Surgical History  Procedure Laterality Date  . Tonsillectomy      Family History  Problem Relation Age of Onset  . Alzheimer's disease Mother   . Alcohol abuse Father   . Cancer Father     prostate cancer  . Coronary artery disease Neg Hx   . Hypertension Neg Hx   . Diabetes Neg Hx     History   Social History  . Marital Status: Married    Spouse Name: N/A    Number of Children: 3  . Years of Education: N/A   Occupational History  . retired Naval architectpurchasing agent   . farm beef cattle    Social History Main Topics  . Smoking status: Former Games developermoker  .  Smokeless tobacco: Never Used  . Alcohol Use: No  . Drug Use: No  . Sexual Activity: Not on file   Other Topics Concern  . Not on file   Social History Narrative   No living will   Daughter Leola Brazilara Pitt, or son [name] has health care POA   Would accept resuscitation   Hasn't thought about feeding tube   .,Review of Systems Sleeps okay Appetite is good--- weight back up to his lifetime usual    Objective:   Physical Exam  Constitutional: He appears well-developed and well-nourished. No distress.  Neck: Normal range of motion. Neck supple. No thyromegaly present.  Cardiovascular: Normal rate, regular rhythm and normal heart sounds.  Exam reveals no gallop.   No murmur heard. Pulmonary/Chest: Effort normal and breath sounds normal. No respiratory distress. He has no wheezes. He has no rales.  Musculoskeletal: He exhibits no edema or tenderness.  Lymphadenopathy:    He has no cervical adenopathy.  Psychiatric: He has a normal mood and affect. His behavior is normal.          Assessment & Plan:

## 2014-11-09 NOTE — Assessment & Plan Note (Signed)
Voiding okay at this point No meds needed

## 2015-02-03 ENCOUNTER — Other Ambulatory Visit: Payer: Self-pay | Admitting: Internal Medicine

## 2015-02-19 ENCOUNTER — Other Ambulatory Visit: Payer: Self-pay | Admitting: Internal Medicine

## 2015-06-01 ENCOUNTER — Encounter: Payer: Self-pay | Admitting: Internal Medicine

## 2015-06-01 ENCOUNTER — Ambulatory Visit (INDEPENDENT_AMBULATORY_CARE_PROVIDER_SITE_OTHER): Payer: PPO | Admitting: Internal Medicine

## 2015-06-01 VITALS — BP 158/90 | HR 82 | Temp 98.2°F | Ht 67.0 in | Wt 201.0 lb

## 2015-06-01 DIAGNOSIS — Z23 Encounter for immunization: Secondary | ICD-10-CM

## 2015-06-01 DIAGNOSIS — G309 Alzheimer's disease, unspecified: Secondary | ICD-10-CM

## 2015-06-01 DIAGNOSIS — R251 Tremor, unspecified: Secondary | ICD-10-CM | POA: Diagnosis not present

## 2015-06-01 DIAGNOSIS — Z0000 Encounter for general adult medical examination without abnormal findings: Secondary | ICD-10-CM

## 2015-06-01 DIAGNOSIS — N400 Enlarged prostate without lower urinary tract symptoms: Secondary | ICD-10-CM | POA: Diagnosis not present

## 2015-06-01 DIAGNOSIS — G308 Other Alzheimer's disease: Secondary | ICD-10-CM

## 2015-06-01 DIAGNOSIS — F0281 Dementia in other diseases classified elsewhere with behavioral disturbance: Secondary | ICD-10-CM

## 2015-06-01 DIAGNOSIS — E538 Deficiency of other specified B group vitamins: Secondary | ICD-10-CM

## 2015-06-01 DIAGNOSIS — F39 Unspecified mood [affective] disorder: Secondary | ICD-10-CM

## 2015-06-01 LAB — CBC WITH DIFFERENTIAL/PLATELET
BASOS ABS: 0 10*3/uL (ref 0.0–0.1)
Basophils Relative: 0.4 % (ref 0.0–3.0)
EOS ABS: 0.1 10*3/uL (ref 0.0–0.7)
Eosinophils Relative: 1.8 % (ref 0.0–5.0)
HCT: 43.1 % (ref 39.0–52.0)
HEMOGLOBIN: 14.5 g/dL (ref 13.0–17.0)
LYMPHS PCT: 14.5 % (ref 12.0–46.0)
Lymphs Abs: 0.8 10*3/uL (ref 0.7–4.0)
MCHC: 33.7 g/dL (ref 30.0–36.0)
MCV: 91.3 fl (ref 78.0–100.0)
MONOS PCT: 7.4 % (ref 3.0–12.0)
Monocytes Absolute: 0.4 10*3/uL (ref 0.1–1.0)
NEUTROS PCT: 75.9 % (ref 43.0–77.0)
Neutro Abs: 4.3 10*3/uL (ref 1.4–7.7)
Platelets: 187 10*3/uL (ref 150.0–400.0)
RBC: 4.71 Mil/uL (ref 4.22–5.81)
RDW: 14.2 % (ref 11.5–15.5)
WBC: 5.7 10*3/uL (ref 4.0–10.5)

## 2015-06-01 LAB — COMPREHENSIVE METABOLIC PANEL
ALK PHOS: 69 U/L (ref 39–117)
ALT: 14 U/L (ref 0–53)
AST: 16 U/L (ref 0–37)
Albumin: 4.1 g/dL (ref 3.5–5.2)
BUN: 15 mg/dL (ref 6–23)
CALCIUM: 9.2 mg/dL (ref 8.4–10.5)
CO2: 27 meq/L (ref 19–32)
Chloride: 106 mEq/L (ref 96–112)
Creatinine, Ser: 1.07 mg/dL (ref 0.40–1.50)
GFR: 71.78 mL/min (ref >60.00)
GLUCOSE: 115 mg/dL — AB (ref 70–99)
Potassium: 3.7 mEq/L (ref 3.5–5.1)
SODIUM: 140 meq/L (ref 135–145)
Total Bilirubin: 0.5 mg/dL (ref 0.2–1.2)
Total Protein: 6.8 g/dL (ref 6.0–8.3)

## 2015-06-01 LAB — VITAMIN B12: Vitamin B-12: 226 pg/mL (ref 211–911)

## 2015-06-01 MED ORDER — ALPRAZOLAM 0.25 MG PO TABS: 0.2500 mg | ORAL_TABLET | Freq: Two times a day (BID) | ORAL | Status: DC | PRN

## 2015-06-01 MED ORDER — DOXAZOSIN MESYLATE 4 MG PO TABS: 4.0000 mg | ORAL_TABLET | Freq: Every day | ORAL | Status: AC

## 2015-06-01 NOTE — Assessment & Plan Note (Signed)
Voids okay on doxazosin  No apparent side effects

## 2015-06-01 NOTE — Progress Notes (Signed)
Pre visit review using our clinic review tool, if applicable. No additional management support is needed unless otherwise documented below in the visit note. 

## 2015-06-01 NOTE — Addendum Note (Signed)
Addended by: Sueanne Margarita on: 06/01/2015 10:51 AM   Modules accepted: Orders

## 2015-06-01 NOTE — Progress Notes (Signed)
Subjective:    Patient ID: Nicholas Kennedy, male    DOB: Apr 27, 1941, 74 y.o.   MRN: [id number]  HPI Here for Medicare wellness and follow up of chronic medical conditions Reviewed form and advanced directives Daughter is with him No other doctors other than dentist Dr Laural Benes. No falls Vision and hearing are okay No exercise No tobacco or alcohol Known cognitive deficits--- see below No hospitalizations or procedures  [facility] per week He likes this Still has day caregiving---daughter and [employer] (other family on weekends) Alone at night--no concerns yet about elopement He has had some hallucinations---seeing man in trailer at the end of a field adjacent to his house Not driving Family handles finances Independent with ADLs but needs prompting. Doesn't fight and get angry much anymore  Still has significant tremor Doesn't seem to affect daily activities  Mood is better Not as physically active Needs the alprazolam on [facility] days---it keeps him calmer (and doesn't try to leave as the day goes on---will get anxious). Rarely needs at home  Voids okay No nocturia No apparent daytime problems No dizziness No chest pain  No SOB  Current Outpatient Prescriptions on File Prior to Visit  Medication Sig Dispense Refill  . ALPRAZolam (XANAX) 0.25 MG tablet Take 1 tablet (0.25 mg total) by mouth 2 (two) times daily as needed for anxiety. 60 tablet 0  . doxazosin (CARDURA) 4 MG tablet TAKE 1 TABLET BY MOUTH AT BEDTIME 30 tablet 3  . sertraline (ZOLOFT) 50 MG tablet TAKE 1 TABLET (50 MG TOTAL) BY MOUTH DAILY. 90 tablet 3   No current facility-administered medications on file prior to visit.    Allergies  Allergen Reactions  . Penicillins     Past Medical History  Diagnosis Date  . Allergy   . Asthma   . Diverticulitis   . Hyperlipidemia   . Peyronie's disease   . ED (erectile dysfunction)   . BPH (benign prostatic hypertrophy)     . History of nephrolithiasis   . Alzheimer's dementia 2014    Past Surgical History  Procedure Laterality Date  . Tonsillectomy      Family History  Problem Relation Age of Onset  . Alzheimer's disease Mother   . Alcohol abuse Father   . Cancer Father     prostate cancer  . Coronary artery disease Neg Hx   . Hypertension Neg Hx   . Diabetes Neg Hx     History   Social History  . Marital Status: Widowed    Spouse Name: N/A  . Number of Children: 3  . Years of Education: N/A   Occupational History  . retired Naval architect   . farm beef cattle    Social History Main Topics  . Smoking status: Former Games developer  . Smokeless tobacco: Never Used  . Alcohol Use: No  . Drug Use: No  . Sexual Activity: Not on file   Other Topics Concern  . Not on file   Social History Narrative   No living will   Daughter [name], or son Tawanna Cooler has health care POA   Would accept resuscitation   Hasn't thought about feeding tube   Review of Systems Sleeps okay  Appetite is fine but not excessive. Weight is 10# higher. ?from decreased activity    Objective:   Physical Exam  Constitutional: He appears well-developed and well-nourished. No distress.  HENT:  Mouth/Throat: Oropharynx is clear and moist. No  oropharyngeal exudate.  Neck: Normal range of motion. Neck supple. No thyromegaly present.  Cardiovascular: Normal rate, regular rhythm, normal heart sounds and intact distal pulses.  Exam reveals no gallop.   No murmur heard. Pulmonary/Chest: Effort normal and breath sounds normal. No respiratory distress. He has no wheezes. He has no rales.  Abdominal: Soft. There is no tenderness.  Musculoskeletal: He exhibits no edema or tenderness.  Lymphadenopathy:    He has no cervical adenopathy.  Skin: No rash noted. No erythema.  Psychiatric: He has a normal mood and affect. His behavior is normal.          Assessment & Plan:

## 2015-06-01 NOTE — Assessment & Plan Note (Signed)
No recent check Not on supplements Will recheck today

## 2015-06-01 NOTE — Assessment & Plan Note (Signed)
Does okay with the sertraline Regularly needs the alprazolam also

## 2015-06-01 NOTE — Assessment & Plan Note (Signed)
Mild meds not needed at this point

## 2015-06-01 NOTE — Assessment & Plan Note (Signed)
Slow progression Will probably need supervision at night soon---daughter aware meds didn't help

## 2015-06-01 NOTE — Assessment & Plan Note (Signed)
I have personally reviewed the Medicare Annual Wellness questionnaire and have noted 1. The patient's medical and social history 2. Their use of alcohol, tobacco or illicit drugs 3. Their current medications and supplements 4. The patient's functional ability including ADL's, fall risks, home safety risks and hearing or visual             impairment. 5. Diet and physical activities 6. Evidence for depression or mood disorders  The patients weight, height, BMI and visual acuity have been recorded in the chart I have made referrals, counseling and provided education to the patient based review of the above and I have provided the pt with a written personalized care plan for preventive services.  I have provided you with a copy of your personalized plan for preventive services. Please take the time to review along with your updated medication list.  Will give prevnar Try to keep him active--family does try No cancer screening given the dementia

## 2015-06-27 ENCOUNTER — Encounter: Payer: Self-pay | Admitting: Internal Medicine

## 2015-06-28 ENCOUNTER — Telehealth: Payer: Self-pay | Admitting: Internal Medicine

## 2015-06-28 ENCOUNTER — Encounter: Payer: Self-pay | Admitting: Internal Medicine

## 2015-06-28 DIAGNOSIS — Z7689 Persons encountering health services in other specified circumstances: Secondary | ICD-10-CM

## 2015-06-28 NOTE — Telephone Encounter (Signed)
Opened in error

## 2015-08-02 ENCOUNTER — Encounter: Payer: Self-pay | Admitting: Internal Medicine

## 2015-08-19 ENCOUNTER — Encounter: Payer: Self-pay | Admitting: Internal Medicine

## 2015-08-19 MED ORDER — LORAZEPAM 0.5 MG PO TABS: 0.5000 mg | ORAL_TABLET | Freq: Every day | ORAL | Status: DC | PRN

## 2015-08-19 NOTE — Telephone Encounter (Signed)
rx called into pharmacy

## 2015-08-19 NOTE — Telephone Encounter (Signed)
Please send Rx for lorazepam 0.5mg   1-2 tabs daily prn for anxiety #60 x 0 Take off the alprazolam

## 2015-09-03 ENCOUNTER — Telehealth: Payer: Self-pay | Admitting: Internal Medicine

## 2015-09-03 NOTE — Telephone Encounter (Signed)
Pts daughter dropped off form to be completed. Form on Dee's desk. She asked if you can please call her when it is ready to be picked up. CB # [phone]

## 2015-09-06 DIAGNOSIS — Z7689 Persons encountering health services in other specified circumstances: Secondary | ICD-10-CM

## 2015-09-06 NOTE — Telephone Encounter (Signed)
Form on your desk  

## 2015-12-01 ENCOUNTER — Encounter: Payer: Self-pay | Admitting: Internal Medicine

## 2015-12-01 ENCOUNTER — Ambulatory Visit (INDEPENDENT_AMBULATORY_CARE_PROVIDER_SITE_OTHER): Payer: PPO | Admitting: Internal Medicine

## 2015-12-01 VITALS — BP 128/80 | HR 80 | Temp 98.1°F | Wt 198.0 lb

## 2015-12-01 DIAGNOSIS — Z23 Encounter for immunization: Secondary | ICD-10-CM

## 2015-12-01 DIAGNOSIS — F0281 Dementia in other diseases classified elsewhere with behavioral disturbance: Secondary | ICD-10-CM

## 2015-12-01 DIAGNOSIS — N400 Enlarged prostate without lower urinary tract symptoms: Secondary | ICD-10-CM

## 2015-12-01 DIAGNOSIS — F39 Unspecified mood [affective] disorder: Secondary | ICD-10-CM

## 2015-12-01 DIAGNOSIS — R251 Tremor, unspecified: Secondary | ICD-10-CM

## 2015-12-01 DIAGNOSIS — G308 Other Alzheimer's disease: Secondary | ICD-10-CM

## 2015-12-01 DIAGNOSIS — G309 Alzheimer's disease, unspecified: Principal | ICD-10-CM

## 2015-12-01 MED ORDER — LORAZEPAM 0.5 MG PO TABS: 1.0000 mg | ORAL_TABLET | Freq: Every day | ORAL | Status: DC | PRN

## 2015-12-01 NOTE — Progress Notes (Signed)
Pre visit review using our clinic review tool, if applicable. No additional management support is needed unless otherwise documented below in the visit note. 

## 2015-12-01 NOTE — Addendum Note (Signed)
Addended by: Sueanne MargaritaSMITH, Teyla Skidgel L on: 12/01/2015 10:42 AM   Modules accepted: Orders

## 2015-12-01 NOTE — Assessment & Plan Note (Signed)
Fairly stable No functional decline Still supervised other than at night (and no apparent elopement risk)

## 2015-12-01 NOTE — Assessment & Plan Note (Signed)
Mild and no functional impairment

## 2015-12-01 NOTE — Assessment & Plan Note (Signed)
Actually better at home [facility] Will continue the meds for now

## 2015-12-01 NOTE — Assessment & Plan Note (Addendum)
Voids okay  No med changes needed

## 2015-12-01 NOTE — Progress Notes (Signed)
   Subjective:    Patient ID: Nicholas Kennedy, male    DOB: 10/16/1941, 74 y.o.   MRN: [id number]  HPI Here with daughter for follow up of chronic medical conditions  He feels things are okay Daughter is satisfied Has someone there during the day--daughter, family and friends and occ home instead  [facility] Still gets anxious there--wants to leave, etc Will be increasing lorazepam to 2 before going  Gets [facility] or others prepare meals Needs reminders for ADLs but still does himself Mood is better at home--- happier now  Voids okay Doesn't report nocturia--or very seldom  Still has tremor in hands No functional problems with this  Current Outpatient Prescriptions on File Prior to Visit  Medication Sig Dispense Refill  . doxazosin (CARDURA) 4 MG tablet Take 1 tablet (4 mg total) by mouth at bedtime. 90 tablet 3  . sertraline (ZOLOFT) 50 MG tablet TAKE 1 TABLET (50 MG TOTAL) BY MOUTH DAILY. 90 tablet 3   No current facility-administered medications on file prior to visit.    Allergies  Allergen Reactions  . Penicillins     Past Medical History  Diagnosis Date  . Allergy   . Asthma   . Diverticulitis   . Hyperlipidemia   . Peyronie's disease   . ED (erectile dysfunction)   . BPH (benign prostatic hypertrophy)   . History of nephrolithiasis   . Alzheimer's dementia 2014    Past Surgical History  Procedure Laterality Date  . Tonsillectomy      Family History  Problem Relation Age of Onset  . Alzheimer's disease Mother   . Alcohol abuse Father   . Cancer Father     prostate cancer  . Coronary artery disease Neg Hx   . Hypertension Neg Hx   . Diabetes Neg Hx     Social History   Social History  . Marital Status: Widowed    Spouse Name: N/A  . Number of Children: 3  . Years of Education: N/A   Occupational History  . retired Naval architectpurchasing agent   . farm beef cattle    Social History Main Topics  . Smoking status: Former Games developermoker  .  Smokeless tobacco: Never Used  . Alcohol Use: No  . Drug Use: No  . Sexual Activity: Not on file   Other Topics Concern  . Not on file   Social History Narrative   No living will   Daughter Leola Brazilara Pitt, or son [name] has health care POA   Would accept resuscitation   Hasn't thought about feeding tube   Review of Systems Appetite is good Sleeping well Weight stable No chest pain No SOB No hallucinations since they bush hogged the field outside his window    Objective:   Physical Exam  Constitutional: He appears well-developed and well-nourished. No distress.  Neck: Normal range of motion. Neck supple. No thyromegaly present.  Cardiovascular: Normal rate, regular rhythm and normal heart sounds.  Exam reveals no gallop.   No murmur heard. Pulmonary/Chest: Effort normal and breath sounds normal. No respiratory distress. He has no wheezes. He has no rales.  Musculoskeletal: He exhibits no edema.  Lymphadenopathy:    He has no cervical adenopathy.  Psychiatric: He has a normal mood and affect. His behavior is normal.          Assessment & Plan:

## 2015-12-23 ENCOUNTER — Telehealth: Payer: Self-pay

## 2015-12-23 NOTE — Telephone Encounter (Signed)
Nicholas Kennedy, [place] v/m requesting cb from Dr Alphonsus SiasLetvak; concerned about pt being able to take care of his affairs and what to do going forward. Pt seen 12/01/15.

## 2015-12-23 NOTE — Telephone Encounter (Signed)
I mailed July letter to patient's son.

## 2015-12-23 NOTE — Telephone Encounter (Signed)
Discussed with son that he will need 24 hour care now or soon for safety. Will consider assisted living  Needs letter about financial issues Please send copy of July letter to son Scherrie Novemberodd Wilsey [address] [place], [place] [id number]

## 2015-12-31 ENCOUNTER — Encounter: Payer: Self-pay | Admitting: Internal Medicine

## 2016-01-03 ENCOUNTER — Encounter: Payer: Self-pay | Admitting: Internal Medicine

## 2016-01-03 ENCOUNTER — Ambulatory Visit (INDEPENDENT_AMBULATORY_CARE_PROVIDER_SITE_OTHER): Payer: PPO | Admitting: Internal Medicine

## 2016-01-03 ENCOUNTER — Ambulatory Visit: Payer: PPO | Admitting: Internal Medicine

## 2016-01-03 VITALS — BP 120/80 | HR 68 | Temp 98.4°F | Wt 203.0 lb

## 2016-01-03 DIAGNOSIS — G308 Other Alzheimer's disease: Secondary | ICD-10-CM | POA: Diagnosis not present

## 2016-01-03 DIAGNOSIS — F0281 Dementia in other diseases classified elsewhere with behavioral disturbance: Secondary | ICD-10-CM

## 2016-01-03 DIAGNOSIS — G309 Alzheimer's disease, unspecified: Principal | ICD-10-CM

## 2016-01-03 MED ORDER — TRAZODONE HCL 50 MG PO TABS: 50.0000 mg | ORAL_TABLET | Freq: Every day | ORAL | Status: DC

## 2016-01-03 NOTE — Progress Notes (Signed)
Pre visit review using our clinic review tool, if applicable. No additional management support is needed unless otherwise documented below in the visit note. 

## 2016-01-03 NOTE — Assessment & Plan Note (Addendum)
He has worsened recently 3 elopement events that are concerning Discussed that he needs in home 24 hour care---or AL She will look into this  Since he isn't sleeping well, will try trazodone

## 2016-01-03 NOTE — Progress Notes (Signed)
   Subjective:    Patient ID: Nicholas Kennedy, male    DOB: 01-Jul-1941, 75 y.o.   MRN: [id number]  HPI Here with daughter Delice Bison  Had phone conversations with son Tawanna Cooler He and Delice Bison are not talking--though still both medical POAs He is not around as much  He did have episode of leaving his house Thought his parents were alive still and he went [address] trying to find them (twice at night and once in day) Family is staying there more Caregiver/family friend has stayed the night at times  Current Outpatient Prescriptions on File Prior to Visit  Medication Sig Dispense Refill  . doxazosin (CARDURA) 4 MG tablet Take 1 tablet (4 mg total) by mouth at bedtime. 90 tablet 3  . LORazepam (ATIVAN) 0.5 MG tablet Take 2 tablets (1 mg total) by mouth daily as needed for anxiety. 60 tablet 0  . sertraline (ZOLOFT) 50 MG tablet TAKE 1 TABLET (50 MG TOTAL) BY MOUTH DAILY. 90 tablet 3   No current facility-administered medications on file prior to visit.    Allergies  Allergen Reactions  . Penicillins     Past Medical History  Diagnosis Date  . Allergy   . Asthma   . Diverticulitis   . Hyperlipidemia   . Peyronie's disease   . ED (erectile dysfunction)   . BPH (benign prostatic hypertrophy)   . History of nephrolithiasis   . Alzheimer's dementia 2014    Past Surgical History  Procedure Laterality Date  . Tonsillectomy      Family History  Problem Relation Age of Onset  . Alzheimer's disease Mother   . Alcohol abuse Father   . Cancer Father     prostate cancer  . Coronary artery disease Neg Hx   . Hypertension Neg Hx   . Diabetes Neg Hx     Social History   Social History  . Marital Status: Widowed    Spouse Name: N/A  . Number of Children: 3  . Years of Education: N/A   Occupational History  . retired Naval architect   . farm beef cattle    Social History Main Topics  . Smoking status: Former Games developer  . Smokeless tobacco: Never Used  . Alcohol Use: No  . Drug  Use: No  . Sexual Activity: Not on file   Other Topics Concern  . Not on file   Social History Narrative   No living will   Daughter [name], or son Tawanna Cooler has health care POA   Would accept resuscitation   Hasn't thought about feeding tube   Review of Systems     Objective:   Physical Exam  Neurological:  Interjects some at times--but totally off topic          Assessment & Plan:

## 2016-01-04 ENCOUNTER — Encounter: Payer: Self-pay | Admitting: Internal Medicine

## 2016-01-05 ENCOUNTER — Telehealth: Payer: Self-pay | Admitting: Internal Medicine

## 2016-01-05 NOTE — Telephone Encounter (Signed)
Wiley will fax information   Letter address to : [place] [address] i'am attending physican for [employer] and i have treated him for ____________.years Mr bjorkman is suffer from ________________ He will be also faxing information that needs to be added to this letter  If you have any questions please call mr wooten [phone]  This needs to be on letterhead this needs to be mailed to  [employer] [address] [place] [address]

## 2016-01-07 ENCOUNTER — Encounter: Payer: Self-pay | Admitting: Internal Medicine

## 2016-01-07 DIAGNOSIS — Z7689 Persons encountering health services in other specified circumstances: Secondary | ICD-10-CM

## 2016-01-07 NOTE — Telephone Encounter (Signed)
Letter done $20 charge 

## 2016-01-07 NOTE — Telephone Encounter (Signed)
I faxed letter to Cecille Aver.

## 2016-01-12 ENCOUNTER — Encounter: Payer: Self-pay | Admitting: Internal Medicine

## 2016-01-12 DIAGNOSIS — Z7689 Persons encountering health services in other specified circumstances: Secondary | ICD-10-CM

## 2016-01-12 NOTE — Telephone Encounter (Signed)
Please fax my last few notes and the copy of the FL-2 Find out if the daughter wants to pick up the original--she may need to $20 charge

## 2016-01-14 ENCOUNTER — Encounter: Payer: Self-pay | Admitting: Internal Medicine

## 2016-01-26 ENCOUNTER — Telehealth: Payer: Self-pay | Admitting: *Deleted

## 2016-01-26 ENCOUNTER — Emergency Department
Admission: EM | Admit: 2016-01-26 | Discharge: 2016-01-26 | Disposition: A | Discharge status: Home / Self Care | Payer: PPO | Attending: Emergency Medicine | Admitting: Emergency Medicine

## 2016-01-26 DIAGNOSIS — Z87891 Personal history of nicotine dependence: Secondary | ICD-10-CM | POA: Insufficient documentation

## 2016-01-26 DIAGNOSIS — Z880 Allergy status to penicillin: Secondary | ICD-10-CM | POA: Insufficient documentation

## 2016-01-26 DIAGNOSIS — G309 Alzheimer's disease, unspecified: Secondary | ICD-10-CM | POA: Insufficient documentation

## 2016-01-26 DIAGNOSIS — F911 Conduct disorder, childhood-onset type: Secondary | ICD-10-CM | POA: Diagnosis not present

## 2016-01-26 DIAGNOSIS — Z79899 Other long term (current) drug therapy: Secondary | ICD-10-CM | POA: Diagnosis not present

## 2016-01-26 DIAGNOSIS — F0281 Dementia in other diseases classified elsewhere with behavioral disturbance: Secondary | ICD-10-CM | POA: Diagnosis not present

## 2016-01-26 MED ORDER — TRAZODONE HCL 100 MG PO TABS: 100.0000 mg | ORAL_TABLET | Freq: Every day | ORAL | Status: DC

## 2016-01-26 MED ORDER — LORAZEPAM 1 MG PO TABS
1.0000 mg | ORAL_TABLET | Freq: Once | ORAL | Status: AC
  Administered 2016-01-26: 1 mg via ORAL
  Filled 2016-01-26: qty 1

## 2016-01-26 NOTE — Telephone Encounter (Signed)
Spoke with [facility] assisted living and pt is doing fine, they are sending over a order that reflects the change in trazodone. They have pt taking 50 mg at bedtime and per daughter she was giving him  at bedtime.

## 2016-01-26 NOTE — ED Notes (Signed)
Pt in from [facility] for aggressive behavior

## 2016-01-26 NOTE — ED Provider Notes (Signed)
[facility] Emergency Department Provider Note  ____________________________________________  Time seen: 3:00 AM  I have reviewed the triage vital signs and the nursing notes.  History Limited secondary to dementia HISTORY  Chief Complaint Aggressive Behavior     HPI Nicholas Kennedy is a 75 y.o. male [place] via EMS from [facility] memory care unit for aggressive behavior. Per EMS facility stated that the patient's daughter wanted a psychiatric consultation to be performed for the patient was returned to [facility]. However patient's daughter presents to the emergency department stating that she did not request a psychiatric evaluation. In addition the patient's daughter states that she believes that her father's recent behavior secondary to the fact that he has not been receiving his appropriate dose of lorazepam or trazodone. Patient presents to the emergency department pleasant demeanor noncombative.     Past Medical History  Diagnosis Date  . Allergy   . Asthma   . Diverticulitis   . Hyperlipidemia   . Peyronie's disease   . ED (erectile dysfunction)   . BPH (benign prostatic hypertrophy)   . History of nephrolithiasis   . Alzheimer's dementia 2014    Patient Active Problem List   Diagnosis Date Noted  . Episodic mood disorder (HCC) 11/09/2014  . Tremor 07/08/2014  . Alzheimer's dementia with behavioral disturbance   . Routine general medical examination at a health care facility 10/11/2012  . Vitamin B12 deficiency 02/20/2011  . HYPERLIPIDEMIA 02/13/2008  . ALLERGIC RHINITIS 02/13/2008  . DIVERTICULOSIS, COLON 02/13/2008  . BPH (benign prostatic hypertrophy) 02/13/2008  . 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 ACTINIC KERATOSIS 02/13/2008    Past Surgical History  Procedure Laterality Date  . Tonsillectomy      Current Outpatient Rx  Name  Route  Sig  Dispense  Refill  . doxazosin (CARDURA) 4 MG tablet   Oral   Take 1 tablet  (4 mg total) by mouth at bedtime.   90 tablet   3   . LORazepam (ATIVAN) 0.5 MG tablet   Oral   Take 2 tablets (1 mg total) by mouth daily as needed for anxiety.   60 tablet   0   . sertraline (ZOLOFT) 50 MG tablet      TAKE 1 TABLET (50 MG TOTAL) BY MOUTH DAILY.   90 tablet   3   . traZODone (DESYREL) 50 MG tablet   Oral   Take 1-2 tablets (50-100 mg total) by mouth at bedtime.   60 tablet   11     Allergies Penicillins  Family History  Problem Relation Age of Onset  . Alzheimer's disease Mother   . Alcohol abuse Father   . Cancer Father     prostate cancer  . Coronary artery disease Neg Hx   . Hypertension Neg Hx   . Diabetes Neg Hx     Social History Social History  Substance Use Topics  . Smoking status: Former Games developer  . Smokeless tobacco: Never Used  . Alcohol Use: No    Review of Systems  Constitutional: Negative for fever. Eyes: Negative for visual changes. ENT: Negative for sore throat. Cardiovascular: Negative for chest pain. Respiratory: Negative for shortness of breath. Gastrointestinal: Negative for abdominal pain, vomiting and diarrhea. Genitourinary: Negative for dysuria. Musculoskeletal: Negative for back pain. Skin: Negative for rash. Neurological: Negative for headaches, focal weakness or numbness. Psychiatric: History of aggressive behavior  10-point ROS otherwise negative.  ____________________________________________   PHYSICAL EXAM:  VITAL SIGNS: ED Triage  Vitals  Enc Vitals Group     BP 01/26/16 0242 134/85 mmHg     Pulse Rate 01/26/16 0242 74     Resp 01/26/16 0242 18     Temp 01/26/16 0242 98.3 F (36.8 C)     Temp Source 01/26/16 0242 Oral     SpO2 01/26/16 0242 95 %     Weight 01/26/16 0242 203 lb (92.08 kg)     Height --      Head Cir --      Peak Flow --      Pain Score 01/26/16 0307 0     Pain Loc --      Pain Edu? --      Excl. in GC? --       Constitutional: Alert and oriented. Well appearing and  in no distress. Eyes: Conjunctivae are normal. PERRL. Normal extraocular movements. ENT   Head: Normocephalic and atraumatic.   Nose: No congestion/rhinnorhea.   Mouth/Throat: Mucous membranes are moist.   Neck: No stridor. Hematological/Lymphatic/Immunilogical: No cervical lymphadenopathy. Cardiovascular: Normal rate, regular rhythm. Normal and symmetric distal pulses are present in all extremities. No murmurs, rubs, or gallops. Respiratory: Normal respiratory effort without tachypnea nor retractions. Breath sounds are clear and equal bilaterally. No wheezes/rales/rhonchi. Gastrointestinal: Soft and nontender. No distention. There is no CVA tenderness. Genitourinary: deferred Musculoskeletal: Nontender with normal range of motion in all extremities. No joint effusions.  No lower extremity tenderness nor edema. Neurologic:  Normal speech and language. No gross focal neurologic deficits are appreciated. Speech is normal.  Skin:  Skin is warm, dry and intact. No rash noted. Psychiatric: Mood and affect are normal. Speech and behavior are normal. Patient exhibits appropriate insight and judgment.     INITIAL IMPRESSION / ASSESSMENT AND PLAN / ED COURSE  Pertinent labs & imaging results that were available during my care of the patient were reviewed by me and considered in my medical decision making (see chart for details).  Patient's daughter advised that she has been caring for her father for over 3 years that he is prescribed lorazepam 0.5 mg which she would give a total of 1 mg with any change in routine or surroundings. In addition she believes that her father has not been receiving his appropriate dose of trazodone. Based on her advised the patient received 1 mg of Ativan as well as 100 mg of trazodone in the emergency department. Patient observed for 2 hours no aggression no combativeness very pleasant throughout ED  stay  ____________________________________________   FINAL CLINICAL IMPRESSION(S) / ED DIAGNOSES  Final diagnoses:  Alzheimer's dementia with behavioral disturbance      Darci Current, MD 01/26/16 [phone]

## 2016-01-26 NOTE — Discharge Instructions (Signed)
Alzheimer Disease °Alzheimer disease is a mental disorder. It causes memory loss and loss of other mental functions, such as learning, thinking, problem solving, communicating, and completing tasks. The mental losses interfere with the ability to perform daily activities at work, at home, or in social situations. °Alzheimer disease usually starts in a person's late 60s or early 70s but can start earlier in life (familial form). The mental changes caused by this disease are permanent and worsen over time. As the illness progresses, the ability to do even the simplest things is lost. Survival with Alzheimer disease ranges from several years to as long as 20 years. °CAUSES °Alzheimer disease is caused by abnormally high levels of a protein (beta-amyloid) in the brain. This protein forms very small deposits within and around the brain's nerve cells. These deposits prevent the nerve cells from working properly. Experts are not certain what causes the beta-amyloid deposits in this disease. °RISK FACTORS °The following major risk factors have been identified: °· Increasing age. °· Certain genetic variations, such as Down syndrome (trisomy 21). °SYMPTOMS °In the early stages of Alzheimer disease, you are still able to perform daily activities but need greater effort, more time, or memory aids. Early symptoms include: °· Mild memory loss of recent events, names, or phone numbers. °· Loss of objects. °· Minor loss of vocabulary. °· Difficulty with complex tasks, such as paying bills or driving in unfamiliar locations. °Other mental functions deteriorate as the disease worsens. These changes slowly go from mild to severe. Symptoms at this stage include: °· Difficulty remembering. You may not be able to recall personal information such as your address and telephone number. You may become confused about the date, the season of the year, or your location. °· Difficulty maintaining attention. You may forget what you wanted to say  during conversations and repeat what you have already said. °· Difficulty learning new information or tasks. You may not remember what you read or the name of a new friend you met. °· Difficulty counting or doing math. You may have difficulty with complex math problems. You may make mistakes in paying bills or managing your checkbook. °· Poor reasoning and judgment. You may make poor decisions or not dress right for the weather. °· Difficulty communicating. You may have regular difficulty remembering words, naming objects, expressing yourself clearly, or writing sentences that make sense. °· Difficulty performing familiar daily activities. You may get lost driving in familiar locations or need help eating, bathing, dressing, grooming, or using the toilet. You may have difficulty maintaining bladder or bowel control. °· Difficulty recognizing familiar faces. You may confuse family members or close friends with one another. You may not recognize a close relative or may mistake strangers for family. °Alzheimer disease also may cause changes in personality and behavior. These changes include:  °· Loss of interest or motivation. °· Social withdrawal. °· Anxiety. °· Difficulty sleeping. °· Uncharacteristic anger or combativeness. °· A false belief that someone is trying to harm you (paranoia). °· Seeing things that are not real (hallucinations). °· Agitation. °Confusion and disruptive behavior are often worse at night and may be triggered by changes in the environment or acute medical issues. °DIAGNOSIS  °Alzheimer disease is diagnosed through an assessment by your health care provider. During this assessment, your health care provider will do the following: °· Ask you and your family, friends, or caregivers questions about your symptoms, their frequency, their duration and progression, and the effect they are having on your life. °·   Ask questions about your personal and family medical history and use of alcohol or drugs,  including prescription medicine. °· Perform a physical exam and order blood tests and brain imaging exams. °Your health care provider may refer you to a specialist for detailed evaluation of your mental functions (neuropsychological testing).  °Many different brain disorders, medical conditions, and certain substances can cause symptoms that resemble Alzheimer disease symptoms. These must be ruled out before this disease can be diagnosed. If Alzheimer disease is diagnosed, it will be considered either "possible" or "probable" Alzheimer disease. "Possible" Alzheimer disease means that your symptoms are typical of the disease and no other disorder is causing them. "Probable" Alzheimer disease means that you also have a family history of the disease or genetic test results that support the diagnosis. Certain tests, mostly used in research studies, are highly specific for Alzheimer disease.  °TREATMENT  °There is currently no cure for this disease. The goals of treatment are to: °· Slow down the progression of the disease. °· Preserve mental function as long as possible. °· Manage behavioral symptoms. °· Make life easier for the person with Alzheimer disease and his or her caregivers. °The following treatment options are available: °· Medicine. Certain medicines may help slow memory loss by changing the level of certain chemicals in the brain. Medicine may also help with behavioral symptoms. °· Talk therapy. Talk therapy provides education, support, and memory aids for people with this disease. It is most effective in the early stages of the illness. °· Caregiving. Caregivers may be family members, friends, or trained medical professionals. They help the person with Alzheimer disease with daily life activities. Caregiving may take place at home or at a nursing facility. °· Family support groups. These provide education, emotional support, and information about community resources to family members who are taking care of  the person with this disease. °  °This information is not intended to replace advice given to you by your health care provider. Make sure you discuss any questions you have with your health care provider. °  °Document Released: 08/08/2004 Document Revised: 12/18/2014 Document Reviewed: 04/04/2013 °Elsevier Interactive Patient Education ©2016 Elsevier [employer] ° °

## 2016-01-27 DIAGNOSIS — J45909 Unspecified asthma, uncomplicated: Secondary | ICD-10-CM | POA: Diagnosis not present

## 2016-01-27 DIAGNOSIS — F0281 Dementia in other diseases classified elsewhere with behavioral disturbance: Secondary | ICD-10-CM | POA: Diagnosis not present

## 2016-01-27 DIAGNOSIS — Z79899 Other long term (current) drug therapy: Secondary | ICD-10-CM | POA: Diagnosis not present

## 2016-01-27 DIAGNOSIS — N400 Enlarged prostate without lower urinary tract symptoms: Secondary | ICD-10-CM | POA: Diagnosis not present

## 2016-01-27 DIAGNOSIS — N529 Male erectile dysfunction, unspecified: Secondary | ICD-10-CM | POA: Diagnosis not present

## 2016-01-27 DIAGNOSIS — K5901 Slow transit constipation: Secondary | ICD-10-CM | POA: Diagnosis not present

## 2016-01-27 DIAGNOSIS — Z87891 Personal history of nicotine dependence: Secondary | ICD-10-CM | POA: Diagnosis not present

## 2016-01-27 DIAGNOSIS — F320 Major depressive disorder, single episode, mild: Secondary | ICD-10-CM | POA: Diagnosis not present

## 2016-01-27 DIAGNOSIS — R410 Disorientation, unspecified: Secondary | ICD-10-CM | POA: Diagnosis not present

## 2016-01-27 DIAGNOSIS — F0391 Unspecified dementia with behavioral disturbance: Secondary | ICD-10-CM | POA: Diagnosis not present

## 2016-01-27 DIAGNOSIS — Z008 Encounter for other general examination: Secondary | ICD-10-CM | POA: Diagnosis not present

## 2016-01-27 DIAGNOSIS — G309 Alzheimer's disease, unspecified: Secondary | ICD-10-CM | POA: Diagnosis not present

## 2016-01-27 DIAGNOSIS — Z880 Allergy status to penicillin: Secondary | ICD-10-CM | POA: Diagnosis not present

## 2016-01-27 DIAGNOSIS — I10 Essential (primary) hypertension: Secondary | ICD-10-CM | POA: Diagnosis not present

## 2016-01-27 DIAGNOSIS — E876 Hypokalemia: Secondary | ICD-10-CM | POA: Diagnosis not present

## 2016-01-27 DIAGNOSIS — N486 Induration penis plastica: Secondary | ICD-10-CM | POA: Diagnosis not present

## 2016-01-27 DIAGNOSIS — F0390 Unspecified dementia without behavioral disturbance: Secondary | ICD-10-CM | POA: Diagnosis not present

## 2016-01-27 DIAGNOSIS — J9811 Atelectasis: Secondary | ICD-10-CM | POA: Diagnosis not present

## 2016-01-27 DIAGNOSIS — R451 Restlessness and agitation: Secondary | ICD-10-CM | POA: Diagnosis not present

## 2016-01-27 DIAGNOSIS — E785 Hyperlipidemia, unspecified: Secondary | ICD-10-CM | POA: Diagnosis not present

## 2016-01-27 NOTE — Telephone Encounter (Signed)
yup--that is fine

## 2016-01-27 NOTE — Telephone Encounter (Signed)
Please fax back signed order.  Have they made arrangements for their in house medical group to take over care?

## 2016-01-27 NOTE — Telephone Encounter (Addendum)
Spoke with [place] and she stated that house Dr is there today and will take over care, she just didn't want to change medication until she officially met the patient.  Order faxed

## 2016-01-28 DIAGNOSIS — J45909 Unspecified asthma, uncomplicated: Secondary | ICD-10-CM | POA: Diagnosis not present

## 2016-01-28 DIAGNOSIS — E782 Mixed hyperlipidemia: Secondary | ICD-10-CM | POA: Diagnosis not present

## 2016-01-28 DIAGNOSIS — F0281 Dementia in other diseases classified elsewhere with behavioral disturbance: Secondary | ICD-10-CM | POA: Diagnosis not present

## 2016-01-28 DIAGNOSIS — F0390 Unspecified dementia without behavioral disturbance: Secondary | ICD-10-CM | POA: Diagnosis not present

## 2016-01-28 DIAGNOSIS — E876 Hypokalemia: Secondary | ICD-10-CM | POA: Diagnosis not present

## 2016-01-28 DIAGNOSIS — G308 Other Alzheimer's disease: Secondary | ICD-10-CM | POA: Diagnosis not present

## 2016-01-28 DIAGNOSIS — N400 Enlarged prostate without lower urinary tract symptoms: Secondary | ICD-10-CM | POA: Diagnosis not present

## 2016-01-29 DIAGNOSIS — F0281 Dementia in other diseases classified elsewhere with behavioral disturbance: Secondary | ICD-10-CM | POA: Diagnosis not present

## 2016-01-29 DIAGNOSIS — G308 Other Alzheimer's disease: Secondary | ICD-10-CM | POA: Diagnosis not present

## 2016-01-29 DIAGNOSIS — F0390 Unspecified dementia without behavioral disturbance: Secondary | ICD-10-CM | POA: Diagnosis not present

## 2016-01-29 DIAGNOSIS — E782 Mixed hyperlipidemia: Secondary | ICD-10-CM | POA: Diagnosis not present

## 2016-01-29 DIAGNOSIS — E876 Hypokalemia: Secondary | ICD-10-CM | POA: Diagnosis not present

## 2016-01-29 DIAGNOSIS — N400 Enlarged prostate without lower urinary tract symptoms: Secondary | ICD-10-CM | POA: Diagnosis not present

## 2016-01-29 DIAGNOSIS — J45909 Unspecified asthma, uncomplicated: Secondary | ICD-10-CM | POA: Diagnosis not present

## 2016-01-30 DIAGNOSIS — E782 Mixed hyperlipidemia: Secondary | ICD-10-CM | POA: Diagnosis not present

## 2016-01-30 DIAGNOSIS — F0281 Dementia in other diseases classified elsewhere with behavioral disturbance: Secondary | ICD-10-CM | POA: Diagnosis not present

## 2016-01-30 DIAGNOSIS — J45909 Unspecified asthma, uncomplicated: Secondary | ICD-10-CM | POA: Diagnosis not present

## 2016-01-30 DIAGNOSIS — E876 Hypokalemia: Secondary | ICD-10-CM | POA: Diagnosis not present

## 2016-01-30 DIAGNOSIS — N400 Enlarged prostate without lower urinary tract symptoms: Secondary | ICD-10-CM | POA: Diagnosis not present

## 2016-01-30 DIAGNOSIS — G308 Other Alzheimer's disease: Secondary | ICD-10-CM | POA: Diagnosis not present

## 2016-01-31 DIAGNOSIS — F0281 Dementia in other diseases classified elsewhere with behavioral disturbance: Secondary | ICD-10-CM | POA: Diagnosis not present

## 2016-01-31 DIAGNOSIS — G308 Other Alzheimer's disease: Secondary | ICD-10-CM | POA: Diagnosis not present

## 2016-02-01 DIAGNOSIS — G308 Other Alzheimer's disease: Secondary | ICD-10-CM | POA: Diagnosis not present

## 2016-02-01 DIAGNOSIS — E782 Mixed hyperlipidemia: Secondary | ICD-10-CM | POA: Diagnosis not present

## 2016-02-01 DIAGNOSIS — E876 Hypokalemia: Secondary | ICD-10-CM | POA: Diagnosis not present

## 2016-02-01 DIAGNOSIS — F0281 Dementia in other diseases classified elsewhere with behavioral disturbance: Secondary | ICD-10-CM | POA: Diagnosis not present

## 2016-02-01 DIAGNOSIS — J45909 Unspecified asthma, uncomplicated: Secondary | ICD-10-CM | POA: Diagnosis not present

## 2016-02-01 DIAGNOSIS — N400 Enlarged prostate without lower urinary tract symptoms: Secondary | ICD-10-CM | POA: Diagnosis not present

## 2016-02-02 DIAGNOSIS — G308 Other Alzheimer's disease: Secondary | ICD-10-CM | POA: Diagnosis not present

## 2016-02-02 DIAGNOSIS — F0281 Dementia in other diseases classified elsewhere with behavioral disturbance: Secondary | ICD-10-CM | POA: Diagnosis not present

## 2016-02-03 DIAGNOSIS — F0281 Dementia in other diseases classified elsewhere with behavioral disturbance: Secondary | ICD-10-CM | POA: Diagnosis not present

## 2016-02-03 DIAGNOSIS — G308 Other Alzheimer's disease: Secondary | ICD-10-CM | POA: Diagnosis not present

## 2016-02-03 DIAGNOSIS — F0390 Unspecified dementia without behavioral disturbance: Secondary | ICD-10-CM | POA: Diagnosis not present

## 2016-02-04 DIAGNOSIS — N400 Enlarged prostate without lower urinary tract symptoms: Secondary | ICD-10-CM | POA: Diagnosis not present

## 2016-02-04 DIAGNOSIS — F0281 Dementia in other diseases classified elsewhere with behavioral disturbance: Secondary | ICD-10-CM | POA: Diagnosis not present

## 2016-02-04 DIAGNOSIS — J45909 Unspecified asthma, uncomplicated: Secondary | ICD-10-CM | POA: Diagnosis not present

## 2016-02-04 DIAGNOSIS — I10 Essential (primary) hypertension: Secondary | ICD-10-CM | POA: Diagnosis not present

## 2016-02-04 DIAGNOSIS — G308 Other Alzheimer's disease: Secondary | ICD-10-CM | POA: Diagnosis not present

## 2016-02-05 DIAGNOSIS — I10 Essential (primary) hypertension: Secondary | ICD-10-CM | POA: Diagnosis not present

## 2016-02-05 DIAGNOSIS — F0390 Unspecified dementia without behavioral disturbance: Secondary | ICD-10-CM | POA: Diagnosis not present

## 2016-02-05 DIAGNOSIS — N400 Enlarged prostate without lower urinary tract symptoms: Secondary | ICD-10-CM | POA: Diagnosis not present

## 2016-02-05 DIAGNOSIS — F0281 Dementia in other diseases classified elsewhere with behavioral disturbance: Secondary | ICD-10-CM | POA: Diagnosis not present

## 2016-02-05 DIAGNOSIS — G308 Other Alzheimer's disease: Secondary | ICD-10-CM | POA: Diagnosis not present

## 2016-02-05 DIAGNOSIS — K5901 Slow transit constipation: Secondary | ICD-10-CM | POA: Diagnosis not present

## 2016-02-05 DIAGNOSIS — Z20828 Contact with and (suspected) exposure to other viral communicable diseases: Secondary | ICD-10-CM | POA: Diagnosis not present

## 2016-02-06 DIAGNOSIS — G308 Other Alzheimer's disease: Secondary | ICD-10-CM | POA: Diagnosis not present

## 2016-02-06 DIAGNOSIS — F0281 Dementia in other diseases classified elsewhere with behavioral disturbance: Secondary | ICD-10-CM | POA: Diagnosis not present

## 2016-02-07 DIAGNOSIS — I10 Essential (primary) hypertension: Secondary | ICD-10-CM | POA: Diagnosis not present

## 2016-02-07 DIAGNOSIS — Z20828 Contact with and (suspected) exposure to other viral communicable diseases: Secondary | ICD-10-CM | POA: Diagnosis not present

## 2016-02-07 DIAGNOSIS — F0281 Dementia in other diseases classified elsewhere with behavioral disturbance: Secondary | ICD-10-CM | POA: Diagnosis not present

## 2016-02-07 DIAGNOSIS — N400 Enlarged prostate without lower urinary tract symptoms: Secondary | ICD-10-CM | POA: Diagnosis not present

## 2016-02-07 DIAGNOSIS — G308 Other Alzheimer's disease: Secondary | ICD-10-CM | POA: Diagnosis not present

## 2016-02-07 DIAGNOSIS — J45909 Unspecified asthma, uncomplicated: Secondary | ICD-10-CM | POA: Diagnosis not present

## 2016-02-08 DIAGNOSIS — J45909 Unspecified asthma, uncomplicated: Secondary | ICD-10-CM | POA: Diagnosis not present

## 2016-02-08 DIAGNOSIS — G308 Other Alzheimer's disease: Secondary | ICD-10-CM | POA: Diagnosis not present

## 2016-02-08 DIAGNOSIS — N400 Enlarged prostate without lower urinary tract symptoms: Secondary | ICD-10-CM | POA: Diagnosis not present

## 2016-02-08 DIAGNOSIS — K5901 Slow transit constipation: Secondary | ICD-10-CM | POA: Diagnosis not present

## 2016-02-08 DIAGNOSIS — I10 Essential (primary) hypertension: Secondary | ICD-10-CM | POA: Diagnosis not present

## 2016-02-08 DIAGNOSIS — F0281 Dementia in other diseases classified elsewhere with behavioral disturbance: Secondary | ICD-10-CM | POA: Diagnosis not present

## 2016-02-08 DIAGNOSIS — E782 Mixed hyperlipidemia: Secondary | ICD-10-CM | POA: Diagnosis not present

## 2016-02-08 DIAGNOSIS — Z20828 Contact with and (suspected) exposure to other viral communicable diseases: Secondary | ICD-10-CM | POA: Diagnosis not present

## 2016-02-10 DIAGNOSIS — E782 Mixed hyperlipidemia: Secondary | ICD-10-CM | POA: Diagnosis not present

## 2016-02-10 DIAGNOSIS — N400 Enlarged prostate without lower urinary tract symptoms: Secondary | ICD-10-CM | POA: Diagnosis not present

## 2016-02-10 DIAGNOSIS — F0390 Unspecified dementia without behavioral disturbance: Secondary | ICD-10-CM | POA: Diagnosis not present

## 2016-02-10 DIAGNOSIS — J45909 Unspecified asthma, uncomplicated: Secondary | ICD-10-CM | POA: Diagnosis not present

## 2016-02-10 DIAGNOSIS — I10 Essential (primary) hypertension: Secondary | ICD-10-CM | POA: Diagnosis not present

## 2016-02-10 DIAGNOSIS — K5901 Slow transit constipation: Secondary | ICD-10-CM | POA: Diagnosis not present

## 2016-02-10 DIAGNOSIS — Z20828 Contact with and (suspected) exposure to other viral communicable diseases: Secondary | ICD-10-CM | POA: Diagnosis not present

## 2016-02-11 DIAGNOSIS — F0390 Unspecified dementia without behavioral disturbance: Secondary | ICD-10-CM | POA: Diagnosis not present

## 2016-02-12 DIAGNOSIS — Z20828 Contact with and (suspected) exposure to other viral communicable diseases: Secondary | ICD-10-CM | POA: Diagnosis not present

## 2016-02-12 DIAGNOSIS — I10 Essential (primary) hypertension: Secondary | ICD-10-CM | POA: Diagnosis not present

## 2016-02-12 DIAGNOSIS — N400 Enlarged prostate without lower urinary tract symptoms: Secondary | ICD-10-CM | POA: Diagnosis not present

## 2016-02-12 DIAGNOSIS — J45909 Unspecified asthma, uncomplicated: Secondary | ICD-10-CM | POA: Diagnosis not present

## 2016-02-12 DIAGNOSIS — F0281 Dementia in other diseases classified elsewhere with behavioral disturbance: Secondary | ICD-10-CM | POA: Diagnosis not present

## 2016-02-12 DIAGNOSIS — K5901 Slow transit constipation: Secondary | ICD-10-CM | POA: Diagnosis not present

## 2016-02-12 DIAGNOSIS — G308 Other Alzheimer's disease: Secondary | ICD-10-CM | POA: Diagnosis not present

## 2016-02-12 DIAGNOSIS — E782 Mixed hyperlipidemia: Secondary | ICD-10-CM | POA: Diagnosis not present

## 2016-02-13 DIAGNOSIS — F0281 Dementia in other diseases classified elsewhere with behavioral disturbance: Secondary | ICD-10-CM | POA: Diagnosis not present

## 2016-02-13 DIAGNOSIS — G308 Other Alzheimer's disease: Secondary | ICD-10-CM | POA: Diagnosis not present

## 2016-02-14 DIAGNOSIS — G308 Other Alzheimer's disease: Secondary | ICD-10-CM | POA: Diagnosis not present

## 2016-02-14 DIAGNOSIS — F0281 Dementia in other diseases classified elsewhere with behavioral disturbance: Secondary | ICD-10-CM | POA: Diagnosis not present

## 2016-02-14 DIAGNOSIS — I10 Essential (primary) hypertension: Secondary | ICD-10-CM | POA: Diagnosis not present

## 2016-02-14 DIAGNOSIS — K5901 Slow transit constipation: Secondary | ICD-10-CM | POA: Diagnosis not present

## 2016-02-14 DIAGNOSIS — Z20828 Contact with and (suspected) exposure to other viral communicable diseases: Secondary | ICD-10-CM | POA: Diagnosis not present

## 2016-02-14 DIAGNOSIS — N400 Enlarged prostate without lower urinary tract symptoms: Secondary | ICD-10-CM | POA: Diagnosis not present

## 2016-02-15 DIAGNOSIS — F0391 Unspecified dementia with behavioral disturbance: Secondary | ICD-10-CM | POA: Diagnosis not present

## 2016-02-15 DIAGNOSIS — Z20828 Contact with and (suspected) exposure to other viral communicable diseases: Secondary | ICD-10-CM | POA: Diagnosis not present

## 2016-02-15 DIAGNOSIS — K5901 Slow transit constipation: Secondary | ICD-10-CM | POA: Diagnosis not present

## 2016-02-15 DIAGNOSIS — E782 Mixed hyperlipidemia: Secondary | ICD-10-CM | POA: Diagnosis not present

## 2016-02-15 DIAGNOSIS — I10 Essential (primary) hypertension: Secondary | ICD-10-CM | POA: Diagnosis not present

## 2016-02-17 DIAGNOSIS — R5381 Other malaise: Secondary | ICD-10-CM | POA: Diagnosis not present

## 2016-02-17 DIAGNOSIS — N400 Enlarged prostate without lower urinary tract symptoms: Secondary | ICD-10-CM | POA: Diagnosis not present

## 2016-02-17 DIAGNOSIS — Z79899 Other long term (current) drug therapy: Secondary | ICD-10-CM | POA: Diagnosis not present

## 2016-02-17 DIAGNOSIS — F4320 Adjustment disorder, unspecified: Secondary | ICD-10-CM | POA: Diagnosis not present

## 2016-02-17 DIAGNOSIS — F329 Major depressive disorder, single episode, unspecified: Secondary | ICD-10-CM | POA: Diagnosis not present

## 2016-02-17 DIAGNOSIS — F419 Anxiety disorder, unspecified: Secondary | ICD-10-CM | POA: Diagnosis not present

## 2016-02-17 DIAGNOSIS — G309 Alzheimer's disease, unspecified: Secondary | ICD-10-CM | POA: Diagnosis not present

## 2016-02-17 DIAGNOSIS — I10 Essential (primary) hypertension: Secondary | ICD-10-CM | POA: Diagnosis not present

## 2016-02-22 DIAGNOSIS — F419 Anxiety disorder, unspecified: Secondary | ICD-10-CM | POA: Diagnosis not present

## 2016-02-22 DIAGNOSIS — R609 Edema, unspecified: Secondary | ICD-10-CM | POA: Diagnosis not present

## 2016-02-22 DIAGNOSIS — F4320 Adjustment disorder, unspecified: Secondary | ICD-10-CM | POA: Diagnosis not present

## 2016-02-22 DIAGNOSIS — I10 Essential (primary) hypertension: Secondary | ICD-10-CM | POA: Diagnosis not present

## 2016-02-22 DIAGNOSIS — G309 Alzheimer's disease, unspecified: Secondary | ICD-10-CM | POA: Diagnosis not present

## 2016-02-22 DIAGNOSIS — F329 Major depressive disorder, single episode, unspecified: Secondary | ICD-10-CM | POA: Diagnosis not present

## 2016-02-23 ENCOUNTER — Encounter: Payer: Self-pay | Admitting: Emergency Medicine

## 2016-02-23 ENCOUNTER — Emergency Department
Admission: EM | Admit: 2016-02-23 | Discharge: 2016-02-24 | Disposition: A | Discharge status: Home / Self Care | Payer: PPO | Attending: Emergency Medicine | Admitting: Emergency Medicine

## 2016-02-23 DIAGNOSIS — E785 Hyperlipidemia, unspecified: Secondary | ICD-10-CM | POA: Insufficient documentation

## 2016-02-23 DIAGNOSIS — Z8546 Personal history of malignant neoplasm of prostate: Secondary | ICD-10-CM | POA: Insufficient documentation

## 2016-02-23 DIAGNOSIS — Z87891 Personal history of nicotine dependence: Secondary | ICD-10-CM | POA: Insufficient documentation

## 2016-02-23 DIAGNOSIS — J45909 Unspecified asthma, uncomplicated: Secondary | ICD-10-CM | POA: Insufficient documentation

## 2016-02-23 DIAGNOSIS — Z79899 Other long term (current) drug therapy: Secondary | ICD-10-CM | POA: Diagnosis not present

## 2016-02-23 DIAGNOSIS — G309 Alzheimer's disease, unspecified: Secondary | ICD-10-CM | POA: Insufficient documentation

## 2016-02-23 DIAGNOSIS — F0281 Dementia in other diseases classified elsewhere with behavioral disturbance: Secondary | ICD-10-CM | POA: Diagnosis not present

## 2016-02-23 DIAGNOSIS — F02818 Dementia in other diseases classified elsewhere, unspecified severity, with other behavioral disturbance: Secondary | ICD-10-CM

## 2016-02-23 DIAGNOSIS — F919 Conduct disorder, unspecified: Secondary | ICD-10-CM | POA: Diagnosis not present

## 2016-02-23 DIAGNOSIS — Z880 Allergy status to penicillin: Secondary | ICD-10-CM | POA: Diagnosis not present
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
## 2016-02-24 DIAGNOSIS — F4320 Adjustment disorder, unspecified: Secondary | ICD-10-CM | POA: Diagnosis not present

## 2016-02-24 DIAGNOSIS — F419 Anxiety disorder, unspecified: Secondary | ICD-10-CM | POA: Diagnosis not present

## 2016-02-24 DIAGNOSIS — I10 Essential (primary) hypertension: Secondary | ICD-10-CM | POA: Diagnosis not present

## 2016-02-24 DIAGNOSIS — G309 Alzheimer's disease, unspecified: Secondary | ICD-10-CM | POA: Diagnosis not present

## 2016-02-24 DIAGNOSIS — R5381 Other malaise: Secondary | ICD-10-CM | POA: Diagnosis not present

## 2016-02-24 DIAGNOSIS — Z79899 Other long term (current) drug therapy: Secondary | ICD-10-CM | POA: Diagnosis not present

## 2016-02-24 DIAGNOSIS — F329 Major depressive disorder, single episode, unspecified: Secondary | ICD-10-CM | POA: Diagnosis not present

## 2016-02-24 LAB — CBC
HCT: 38.1 % — ABNORMAL LOW (ref 40.0–52.0)
Hemoglobin: 12.8 g/dL — ABNORMAL LOW (ref 13.0–18.0)
MCH: 30.2 pg (ref 26.0–34.0)
MCHC: 33.6 g/dL (ref 32.0–36.0)
MCV: 90 fL (ref 80.0–100.0)
Platelets: 193 10*3/uL (ref 150–440)
RBC: 4.24 MIL/uL — ABNORMAL LOW (ref 4.40–5.90)
RDW: 14 % (ref 11.5–14.5)
WBC: 5.5 10*3/uL (ref 3.8–10.6)

## 2016-02-24 LAB — BASIC METABOLIC PANEL
Anion gap: 5 (ref 5–15)
BUN: 36 mg/dL — AB (ref 6–20)
CHLORIDE: 106 mmol/L (ref 101–111)
CO2: 26 mmol/L (ref 22–32)
CREATININE: 0.84 mg/dL (ref 0.61–1.24)
Calcium: 8.5 mg/dL — ABNORMAL LOW (ref 8.9–10.3)
Glucose, Bld: 93 mg/dL (ref 65–99)
POTASSIUM: 3.8 mmol/L (ref 3.5–5.1)
SODIUM: 137 mmol/L (ref 135–145)

## 2016-02-24 MED ORDER — LORAZEPAM 0.5 MG PO TABS
0.5000 mg | ORAL_TABLET | Freq: Once | ORAL | Status: AC
  Administered 2016-02-24: 0.5 mg via ORAL
  Filled 2016-02-24: qty 1

## 2016-02-24 MED ORDER — OLANZAPINE 5 MG PO TABS
7.5000 mg | ORAL_TABLET | Freq: Every day | ORAL | Status: DC
  Administered 2016-02-24: 7.5 mg via ORAL
  Filled 2016-02-24: qty 2
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
## 2016-02-24 NOTE — Discharge Instructions (Signed)
Please increase Depakote ER to 500 mg by mouth twice daily. Please have a Depakote level checked with your primary doctor in 3 days (02/19/2016). Please decrease Prozac to 40 mg by mouth each morning.  Follow up closely with your primary care doctor, within 3 days.  Return to the emergency room should behavioral problems worsen, or patient develops other new concerns like fever, weakness, cough, more to eat, seems lethargic or confused more than usual, or new concerns arise.  Alzheimer Disease Alzheimer disease is a mental disorder. It causes memory loss and loss of other mental functions, such as learning, thinking, problem solving, communicating, and completing tasks. The mental losses interfere with the ability to perform daily activities at work, at home, or in social situations. Alzheimer disease usually starts in a person's late 20s or early 42s but can start earlier in life (familial form). The mental changes caused by this disease are permanent and worsen over time. As the illness progresses, the ability to do even the simplest things is lost. Survival with Alzheimer disease ranges from several years to as long as 20 years. CAUSES Alzheimer disease is caused by abnormally high levels of a protein (beta-amyloid) in the brain. This protein forms very small deposits within and around the brain's nerve cells. These deposits prevent the nerve cells from working properly. Experts are not certain what causes the beta-amyloid deposits in this disease. RISK FACTORS The following major risk factors have been identified:  Increasing age.  Certain genetic variations, such as Down syndrome (trisomy 21). SYMPTOMS In the early stages of Alzheimer disease, you are still able to perform daily activities but need greater effort, more time, or memory aids. Early symptoms include:  Mild memory loss of recent events, names, or phone numbers.  Loss of objects.  Minor loss of vocabulary.  Difficulty with  complex tasks, such as paying bills or driving in unfamiliar locations. Other mental functions deteriorate as the disease worsens. These changes slowly go from mild to severe. Symptoms at this stage include:  Difficulty remembering. You may not be able to recall personal information such as your address and telephone number. You may become confused about the date, the season of the year, or your location.  Difficulty maintaining attention. You may forget what you wanted to say during conversations and repeat what you have already said.  Difficulty learning new information or tasks. You may not remember what you read or the name of a new friend you met.  Difficulty counting or doing math. You may have difficulty with complex math problems. You may make mistakes in paying bills or managing your checkbook.  Poor reasoning and judgment. You may make poor decisions or not dress right for the weather.  Difficulty communicating. You may have regular difficulty remembering words, naming objects, expressing yourself clearly, or writing sentences that make sense.  Difficulty performing familiar daily activities. You may get lost driving in familiar locations or need help eating, bathing, dressing, grooming, or using the toilet. You may have difficulty maintaining bladder or bowel control.  Difficulty recognizing familiar faces. You may confuse family members or close friends with one another. You may not recognize a close relative or may mistake strangers for family. Alzheimer disease also may cause changes in personality and behavior. These changes include:   Loss of interest or motivation.  Social withdrawal.  Anxiety.  Difficulty sleeping.  Uncharacteristic anger or combativeness.  A false belief that someone is trying to harm you (paranoia).  Seeing things that  are not real (hallucinations).  Agitation. Confusion and disruptive behavior are often worse at night and may be triggered by  changes in the environment or acute medical issues. DIAGNOSIS  Alzheimer disease is diagnosed through an assessment by your health care provider. During this assessment, your health care provider will do the following:  Ask you and your family, friends, or caregivers questions about your symptoms, their frequency, their duration and progression, and the effect they are having on your life.  Ask questions about your personal and family medical history and use of alcohol or drugs, including prescription medicine.  Perform a physical exam and order blood tests and brain imaging exams. Your health care provider may refer you to a specialist for detailed evaluation of your mental functions (neuropsychological testing).  Many different brain disorders, medical conditions, and certain substances can cause symptoms that resemble Alzheimer disease symptoms. These must be ruled out before this disease can be diagnosed. If Alzheimer disease is diagnosed, it will be considered either "possible" or "probable" Alzheimer disease. "Possible" Alzheimer disease means that your symptoms are typical of the disease and no other disorder is causing them. "Probable" Alzheimer disease means that you also have a family history of the disease or genetic test results that support the diagnosis. Certain tests, mostly used in research studies, are highly specific for Alzheimer disease.  TREATMENT  There is currently no cure for this disease. The goals of treatment are to:  Slow down the progression of the disease.  Preserve mental function as long as possible.  Manage behavioral symptoms.  Make life easier for the person with Alzheimer disease and his or her caregivers. The following treatment options are available:  Medicine. Certain medicines may help slow memory loss by changing the level of certain chemicals in the brain. Medicine may also help with behavioral symptoms.  Talk therapy. Talk therapy provides  education, support, and memory aids for people with this disease. It is most effective in the early stages of the illness.  Caregiving. Caregivers may be family members, friends, or trained medical professionals. They help the person with Alzheimer disease with daily life activities. Caregiving may take place at home or at a nursing facility.  Family support groups. These provide education, emotional support, and information about community resources to family members who are taking care of the person with this disease.   This information is not intended to replace advice given to you by your health care provider. Make sure you discuss any questions you have with your health care provider.   Document Released: 08/08/2004 Document Revised: 12/18/2014 Document Reviewed: 04/04/2013 Elsevier Interactive Patient Education [employer].

## 2016-02-26 ENCOUNTER — Other Ambulatory Visit: Payer: Self-pay | Admitting: Internal Medicine

## 2016-02-28 DIAGNOSIS — G4700 Insomnia, unspecified: Secondary | ICD-10-CM | POA: Diagnosis not present

## 2016-02-28 DIAGNOSIS — F411 Generalized anxiety disorder: Secondary | ICD-10-CM | POA: Diagnosis not present

## 2016-02-28 DIAGNOSIS — F0281 Dementia in other diseases classified elsewhere with behavioral disturbance: Secondary | ICD-10-CM | POA: Diagnosis not present

## 2016-02-28 DIAGNOSIS — F062 Psychotic disorder with delusions due to known physiological condition: Secondary | ICD-10-CM | POA: Diagnosis not present

## 2016-02-28 DIAGNOSIS — F321 Major depressive disorder, single episode, moderate: Secondary | ICD-10-CM | POA: Diagnosis not present

## 2016-02-29 ENCOUNTER — Telehealth: Payer: Self-pay

## 2016-02-29 NOTE — Telephone Encounter (Signed)
Spoke to daughter, Delice Bisonara. She said he is under the care of [facility] now. She said Dr Alphonsus SiasLetvak would be surprised at the amount of decline he has had since January. She said he is having major issues with anger. The last medication adjustment they made recently may be helping some. She appreciated us checking on him.

## 2016-02-29 NOTE — Telephone Encounter (Signed)
Okay Please take me off as PCP

## 2016-03-02 ENCOUNTER — Encounter: Payer: Self-pay | Admitting: Emergency Medicine

## 2016-03-02 ENCOUNTER — Emergency Department
Admission: EM | Admit: 2016-03-02 | Discharge: 2016-03-03 | Disposition: A | Discharge status: Home / Self Care | Payer: PPO | Attending: Emergency Medicine | Admitting: Emergency Medicine

## 2016-03-02 DIAGNOSIS — Z880 Allergy status to penicillin: Secondary | ICD-10-CM | POA: Insufficient documentation

## 2016-03-02 DIAGNOSIS — F919 Conduct disorder, unspecified: Secondary | ICD-10-CM | POA: Diagnosis not present

## 2016-03-02 DIAGNOSIS — F02818 Dementia in other diseases classified elsewhere, unspecified severity, with other behavioral disturbance: Secondary | ICD-10-CM | POA: Diagnosis present

## 2016-03-02 DIAGNOSIS — F0281 Dementia in other diseases classified elsewhere with behavioral disturbance: Secondary | ICD-10-CM | POA: Insufficient documentation

## 2016-03-02 DIAGNOSIS — Z79899 Other long term (current) drug therapy: Secondary | ICD-10-CM | POA: Insufficient documentation

## 2016-03-02 DIAGNOSIS — G309 Alzheimer's disease, unspecified: Secondary | ICD-10-CM | POA: Diagnosis not present

## 2016-03-02 DIAGNOSIS — Z87891 Personal history of nicotine dependence: Secondary | ICD-10-CM | POA: Diagnosis not present

## 2016-03-02 DIAGNOSIS — G308 Other Alzheimer's disease: Secondary | ICD-10-CM | POA: Diagnosis not present

## 2016-03-02 LAB — COMPREHENSIVE METABOLIC PANEL
ALK PHOS: 73 U/L (ref 38–126)
ALT: 26 U/L (ref 17–63)
ANION GAP: 5 (ref 5–15)
AST: 27 U/L (ref 15–41)
Albumin: 3.9 g/dL (ref 3.5–5.0)
BILIRUBIN TOTAL: 0.5 mg/dL (ref 0.3–1.2)
BUN: 31 mg/dL — ABNORMAL HIGH (ref 6–20)
CALCIUM: 8.3 mg/dL — AB (ref 8.9–10.3)
CO2: 26 mmol/L (ref 22–32)
CREATININE: 1.07 mg/dL (ref 0.61–1.24)
Chloride: 107 mmol/L (ref 101–111)
GFR calc non Af Amer: >60 mL/min (ref >60)
GLUCOSE: 118 mg/dL — AB (ref 65–99)
Potassium: 4.1 mmol/L (ref 3.5–5.1)
Sodium: 138 mmol/L (ref 135–145)
TOTAL PROTEIN: 6.7 g/dL (ref 6.5–8.1)

## 2016-03-02 LAB — CBC
HEMATOCRIT: 37.5 % — AB (ref 40.0–52.0)
Hemoglobin: 12.7 g/dL — ABNORMAL LOW (ref 13.0–18.0)
MCH: 30.6 pg (ref 26.0–34.0)
MCHC: 33.9 g/dL (ref 32.0–36.0)
MCV: 90.2 fL (ref 80.0–100.0)
Platelets: 135 10*3/uL — ABNORMAL LOW (ref 150–440)
RBC: 4.15 MIL/uL — ABNORMAL LOW (ref 4.40–5.90)
RDW: 14.5 % (ref 11.5–14.5)
WBC: 6.6 10*3/uL (ref 3.8–10.6)

## 2016-03-02 LAB — SALICYLATE LEVEL

## 2016-03-02 LAB — ACETAMINOPHEN LEVEL

## 2016-03-02 LAB — ETHANOL: Alcohol, Ethyl (B): <5 mg/dL (ref <5)
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
## 2016-03-02 NOTE — ED Provider Notes (Signed)
[facility] Emergency Department Provider Note  ____________________________________________  Time seen: 11:20 PM  I have reviewed the triage vital signs and the nursing notes.  History obtained from the patient's daughter secondary to dementia HISTORY  Chief Complaint Aggressive Behavior      HPI Nicholas Kennedy is a 75 y.o. male with history of Alzheimer's disease with behavioral disturbances including agitation/aggressive behavior presents from [facility] involuntarily committed secondary to aggressive behavior towards a staff at [employer] today. Patient's daughter states that while he is calm and cooperative at this time he has behavioral outbursts is where he does become violent.    Past Medical History  Diagnosis Date  . Allergy   . Asthma   . Diverticulitis   . Hyperlipidemia   . Peyronie's disease   . ED (erectile dysfunction)   . BPH (benign prostatic hypertrophy)   . History of nephrolithiasis   . Alzheimer's dementia 2014    Patient Active Problem List   Diagnosis Date Noted  . Episodic mood disorder (HCC) 11/09/2014  . Tremor 07/08/2014  . Alzheimer's dementia with behavioral disturbance   . Routine general medical examination at a health care facility 10/11/2012  . Vitamin B12 deficiency 02/20/2011  . HYPERLIPIDEMIA 02/13/2008  . ALLERGIC RHINITIS 02/13/2008  . DIVERTICULOSIS, COLON 02/13/2008  . BPH (benign prostatic hypertrophy) 02/13/2008  . 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 ACTINIC KERATOSIS 02/13/2008    Past Surgical History  Procedure Laterality Date  . Tonsillectomy      Current Outpatient Rx  Name  Route  Sig  Dispense  Refill  . divalproex (DEPAKOTE SPRINKLE) 125 MG capsule   Oral   Take 250 mg by mouth 2 (two) times daily.         Marland Kitchen doxazosin (CARDURA) 4 MG tablet   Oral   Take 1 tablet (4 mg total) by mouth at bedtime.   90 tablet   3   . lisinopril (PRINIVIL,ZESTRIL) 5 MG tablet    Oral   Take 5 mg by mouth daily.         Marland Kitchen LORazepam (ATIVAN) 0.5 MG tablet   Oral   Take 0.5 mg by mouth daily.         Marland Kitchen LORazepam (ATIVAN) 0.5 MG tablet   Oral   Take 0.5 mg by mouth every 8 (eight) hours as needed for anxiety.         . memantine (NAMENDA) 5 MG tablet   Oral   Take 5 mg by mouth 2 (two) times daily.         Marland Kitchen OLANZapine (ZYPREXA) 7.5 MG tablet   Oral   Take 7.5 mg by mouth at bedtime.         Marland Kitchen omega-3 acid ethyl esters (LOVAZA) 1 g capsule   Oral   Take 1 g by mouth 2 (two) times daily.         Marland Kitchen oseltamivir (TAMIFLU) 75 MG capsule   Oral   Take 75 mg by mouth at bedtime.         . psyllium (REGULOID) 0.52 g capsule   Oral   Take 0.52 g by mouth daily.         . sertraline (ZOLOFT) 50 MG tablet   Oral   Take 50 mg by mouth daily.         . sertraline (ZOLOFT) 50 MG tablet      TAKE 1 TABLET (50 MG TOTAL) BY MOUTH DAILY.  90 tablet   3   . traZODone (DESYREL) 100 MG tablet   Oral   Take 100 mg by mouth at bedtime.           Allergies Penicillins  Family History  Problem Relation Age of Onset  . Alzheimer's disease Mother   . Alcohol abuse Father   . Cancer Father     prostate cancer  . Coronary artery disease Neg Hx   . Hypertension Neg Hx   . Diabetes Neg Hx     Social History Social History  Substance Use Topics  . Smoking status: Former Games developermoker  . Smokeless tobacco: Never Used  . Alcohol Use: No    Review of Systems  Constitutional: Negative for fever. Eyes: Negative for visual changes. ENT: Negative for sore throat. Cardiovascular: Negative for chest pain. Respiratory: Negative for shortness of breath. Gastrointestinal: Negative for abdominal pain, vomiting and diarrhea. Genitourinary: Negative for dysuria. Musculoskeletal: Negative for back pain. Skin: Negative for rash. Neurological: Negative for headaches, focal weakness or numbness. Psychiatric: Positive for aggressive behavior,  dementia 10-point ROS otherwise negative.  ____________________________________________   PHYSICAL EXAM:  VITAL SIGNS: ED Triage Vitals  Enc Vitals Group     BP 03/02/16 2204 137/77 mmHg     Pulse Rate 03/02/16 2204 71     Resp 03/02/16 2204 16     Temp 03/02/16 2204 97.3 F (36.3 C)     Temp Source 03/02/16 2204 Oral     SpO2 03/02/16 2204 100 %     Weight 03/02/16 2204 200 lb (90.719 kg)     Height 03/02/16 2204 5\' 9"  (1.753 m)     Head Cir --      Peak Flow --      Pain Score --      Pain Loc --      Pain Edu? --      Excl. in GC? --      Constitutional: Alert and oriented. Well appearing and in no distress. Eyes: Conjunctivae are normal. PERRL. Normal extraocular movements. ENT   Head: Normocephalic and atraumatic.   Nose: No congestion/rhinnorhea.   Mouth/Throat: Mucous membranes are moist.   Neck: No stridor. Hematological/Lymphatic/Immunilogical: No cervical lymphadenopathy. Cardiovascular: Normal rate, regular rhythm. Normal and symmetric distal pulses are present in all extremities. No murmurs, rubs, or gallops. Respiratory: Normal respiratory effort without tachypnea nor retractions. Breath sounds are clear and equal bilaterally. No wheezes/rales/rhonchi. Gastrointestinal: Soft and nontender. No distention. There is no CVA tenderness. Genitourinary: deferred Musculoskeletal: Nontender with normal range of motion in all extremities. No joint effusions.  No lower extremity tenderness nor edema. Neurologic:  Normal speech and language. No gross focal neurologic deficits are appreciated. Speech is normal.  Skin:  Skin is warm, dry and intact. No rash noted. Psychiatric: Mood and affect are normal. Speech and behavior are normal. Patient exhibits appropriate insight and judgment.  ____________________________________________    LABS (pertinent positives/negatives)  Labs Reviewed  COMPREHENSIVE METABOLIC PANEL - Abnormal; Notable for the following:     Glucose, Bld 118 (*)    BUN 31 (*)    Calcium 8.3 (*)    All other components within normal limits  ACETAMINOPHEN LEVEL - Abnormal; Notable for the following:    Acetaminophen (Tylenol), Serum <10 (*)    All other components within normal limits  CBC - Abnormal; Notable for the following:    RBC 4.15 (*)    Hemoglobin 12.7 (*)    HCT 37.5 (*)  Platelets 135 (*)    All other components within normal limits  ETHANOL  SALICYLATE LEVEL  URINE DRUG SCREEN, QUALITATIVE ([facility] ONLY)      INITIAL IMPRESSION / ASSESSMENT AND PLAN / ED COURSE  Pertinent labs & imaging results that were available during my care of the patient were reviewed by me and considered in my medical decision making (see chart for details).  Patient with Alzheimer's disease awaiting psychiatric evaluation  ____________________________________________   FINAL CLINICAL IMPRESSION(S) / ED DIAGNOSES  Final diagnoses:  None      Darci Current, MD 03/03/16 [phone]

## 2016-03-03 DIAGNOSIS — G308 Other Alzheimer's disease: Secondary | ICD-10-CM

## 2016-03-03 DIAGNOSIS — R4182 Altered mental status, unspecified: Secondary | ICD-10-CM | POA: Diagnosis not present

## 2016-03-03 LAB — URINE DRUG SCREEN, QUALITATIVE (ARMC ONLY)
AMPHETAMINES, UR SCREEN: NOT DETECTED
BENZODIAZEPINE, UR SCRN: NOT DETECTED
Barbiturates, Ur Screen: NOT DETECTED
CANNABINOID 50 NG, UR ~~LOC~~: NOT DETECTED
Cocaine Metabolite,Ur ~~LOC~~: NOT DETECTED
MDMA (Ecstasy)Ur Screen: NOT DETECTED
Methadone Scn, Ur: NOT DETECTED
OPIATE, UR SCREEN: NOT DETECTED
PHENCYCLIDINE (PCP) UR S: NOT DETECTED
Tricyclic, Ur Screen: NOT DETECTED

## 2016-03-03 LAB — URINALYSIS COMPLETE WITH MICROSCOPIC (ARMC ONLY)
BILIRUBIN URINE: NEGATIVE
Bacteria, UA: NONE SEEN
Glucose, UA: NEGATIVE mg/dL
Hgb urine dipstick: NEGATIVE
Leukocytes, UA: NEGATIVE
Nitrite: NEGATIVE
PH: 6 (ref 5.0–8.0)
PROTEIN: NEGATIVE mg/dL
Specific Gravity, Urine: 1.014 (ref 1.005–1.030)

## 2016-03-03 MED ORDER — HALOPERIDOL 0.5 MG PO TABS
0.5000 mg | ORAL_TABLET | Freq: Two times a day (BID) | ORAL | Status: DC
  Filled 2016-03-03: qty 1

## 2016-03-03 MED ORDER — MEMANTINE HCL 5 MG PO TABS: 5.0000 mg | ORAL_TABLET | Freq: Two times a day (BID) | ORAL | Status: AC

## 2016-03-03 MED ORDER — DIVALPROEX SODIUM 500 MG PO DR TAB: 500.0000 mg | DELAYED_RELEASE_TABLET | Freq: Two times a day (BID) | ORAL | Status: DC

## 2016-03-03 MED ORDER — OLANZAPINE 10 MG PO TABS
10.0000 mg | ORAL_TABLET | Freq: Every day | ORAL | Status: DC
  Administered 2016-03-03: 10 mg via ORAL
  Filled 2016-03-03: qty 1

## 2016-03-03 MED ORDER — LISINOPRIL 5 MG PO TABS: 5.0000 mg | ORAL_TABLET | Freq: Every day | ORAL | Status: AC

## 2016-03-03 MED ORDER — TRAZODONE HCL 100 MG PO TABS: 100.0000 mg | ORAL_TABLET | Freq: Every day | ORAL | Status: DC

## 2016-03-03 MED ORDER — HALOPERIDOL 5 MG PO TABS
2.5000 mg | ORAL_TABLET | Freq: Once | ORAL | Status: AC
  Administered 2016-03-03: 2.5 mg via ORAL

## 2016-03-03 MED ORDER — MELATONIN 1 MG PO TABS: 1.0000 mg | ORAL_TABLET | Freq: Every evening | ORAL | Status: DC | PRN

## 2016-03-03 MED ORDER — TRAZODONE HCL 100 MG PO TABS
100.0000 mg | ORAL_TABLET | Freq: Every day | ORAL | Status: DC
  Administered 2016-03-03: 100 mg via ORAL
  Filled 2016-03-03: qty 1

## 2016-03-03 MED ORDER — HALOPERIDOL 0.5 MG PO TABS
0.5000 mg | ORAL_TABLET | Freq: Two times a day (BID) | ORAL | Status: DC
Start: 2016-03-03 — End: 2016-07-28

## 2016-03-03 MED ORDER — SERTRALINE HCL 50 MG PO TABS: ORAL_TABLET | ORAL | Status: DC

## 2016-03-03 MED ORDER — OLANZAPINE 10 MG PO TABS: 10.0000 mg | ORAL_TABLET | Freq: Every day | ORAL | Status: DC

## 2016-03-03 MED ORDER — HALOPERIDOL 5 MG PO TABS
ORAL_TABLET | ORAL | Status: AC
  Administered 2016-03-03: 2.5 mg via ORAL
  Filled 2016-03-03: qty 1

## 2016-03-03 NOTE — Progress Notes (Signed)
LCSW called [place] and there is NO BEDs available until Monday. Was advised to call back then.   Delta Air LinesClaudine Christpher Stogsdill LCSW [phone]

## 2016-03-03 NOTE — BH Assessment (Signed)
Assessment Note  Nicholas Kennedy is an 75 y.o. male, with a prior diagnosis of Alzheimer's demential with behavioral disturbance,  presenting to the ED under IVC, accompanied by his daughter for concerns of aggressive behavior.  According to patient's daughter, patient is "sundowning" and has become increasingly aggressive towards staff at [facility].    Pt [facility] he was being treated for dementia related behaviors.  Pt's daughter brought pt to the ED requesting that [facility].  Pt's daughter has [facility] talked [facility] who informed her that there were no beds available.   Diagnosis: Dementia  Past Medical History:  Past Medical History  Diagnosis Date  . Allergy   . Asthma   . Diverticulitis   . Hyperlipidemia   . Peyronie's disease   . ED (erectile dysfunction)   . BPH (benign prostatic hypertrophy)   . History of nephrolithiasis   . Alzheimer's dementia 2014    Past Surgical History  Procedure Laterality Date  . Tonsillectomy      Family History:  Family History  Problem Relation Age of Onset  . Alzheimer's disease Mother   . Alcohol abuse Father   . Cancer Father     prostate cancer  . Coronary artery disease Neg Hx   . Hypertension Neg Hx   . Diabetes Neg Hx     Social History:  reports that he has quit smoking. He has never used smokeless tobacco. He reports that he does not drink alcohol or use illicit drugs.  Additional Social History:  Alcohol / Drug Use History of alcohol / drug use?: No history of alcohol / drug abuse  CIWA: CIWA-Ar BP: 137/77 mmHg Pulse Rate: 71 COWS:    Allergies:  Allergies  Allergen Reactions  . Penicillins Other (See Comments)    Reaction: unknown Unknown answers to follow up questions    Home Medications:  (Not in a hospital admission)  OB/GYN Status:  No LMP for male patient.  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[facility]) Can pt return to current living arrangement?: Yes Admission Status: Involuntary Is patient capable of signing voluntary admission?: No Referral Source: Self/Family/Friend Insurance type: [employer]  Medical Screening Exam [facility] Walk-in ONLY) Medical Exam completed: Yes  Crisis Care Plan 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 : No Suicidal Intent: No Has patient had any suicidal intent within the past 6 months prior to admission? : No Is patient at risk for suicide?: No Suicidal Plan?: No Has patient had any suicidal plan within the past 6 months prior to admission? : No Access to Means: No What has been your use of drugs/alcohol within the last 12 months?: None identified Previous Attempts/Gestures: No How many times?: 0 Other Self Harm Risks: None identified Triggers for Past Attempts: None known Intentional  Self Injurious Behavior: None Family Suicide History: Unknown Recent stressful life event(s): Recent negative physical changes Persecutory voices/beliefs?: No Depression: No Substance abuse history and/or treatment for substance abuse?: No Suicide prevention information given to non-admitted patients: Not applicable  Risk to Others within  the past 6 months Homicidal Ideation: No Does patient have any lifetime risk of violence toward others beyond the six months prior to admission? : No Thoughts of Harm to Others: No Current Homicidal Intent: No Current Homicidal Plan: No Access to Homicidal Means: No Identified Victim: None identified History of harm to others?: No Assessment of Violence: None Noted Violent Behavior Description: None identified Does patient have access to weapons?: No Criminal Charges Pending?: No Does patient have a court date: No Is patient on probation?: No  Psychosis Hallucinations: None noted Delusions: None noted  Mental Status Report Appearance/Hygiene: Unremarkable Eye Contact: Good Motor Activity: Agitation, Freedom of movement Speech: Logical/coherent Level of Consciousness: Combative Mood: Irritable Affect: Appropriate to circumstance Anxiety Level: Minimal Thought Processes: Circumstantial Judgement: Partial Orientation: Person, Place Obsessive Compulsive Thoughts/Behaviors: None  Cognitive Functioning Concentration: Good Memory: Recent Impaired IQ: Average Insight: Fair Impulse Control: Fair Appetite: Fair Weight Loss: 0 Weight Gain: 0 Sleep: No Change Vegetative Symptoms: None  ADLScreening [facility] Assessment Services) Patient's cognitive ability adequate to safely complete daily activities?: Yes Patient able to express need for assistance with ADLs?: Yes Independently performs ADLs?: Yes (appropriate for developmental age)  Prior Inpatient Therapy Prior Inpatient Therapy: Yes Prior Therapy Dates: 02/2016 Prior Therapy Facilty/Provider(s): [facility] Reason for Treatment: Dementia  Prior Outpatient Therapy Prior Outpatient Therapy: No Prior Therapy Dates: N/A Prior Therapy Facilty/Provider(s): N/A Reason for Treatment: N/A Does patient have an ACCT team?: No Does patient have Intensive In-House Services?  : No Does patient have [facility] services?  : No Does patient have P4CC services?: No  ADL Screening (condition at time of admission) Patient's cognitive ability adequate to safely complete daily activities?: Yes Patient able to express need for assistance with ADLs?: Yes Independently performs ADLs?: Yes (appropriate for developmental age)       Abuse/Neglect Assessment (Assessment to be complete while patient is alone) Physical Abuse: Denies Verbal Abuse: Denies Sexual Abuse: Denies Exploitation of patient/patient's resources: Denies Self-Neglect: Denies Values / Beliefs Cultural Requests During Hospitalization: None Spiritual Requests During Hospitalization: None Consults Spiritual Care Consult Needed: No Social Work Consult Needed: Yes (Comment) (Pt needs assistance with placement) Advance Directives (For Healthcare) Does patient have an advance directive?: Yes Type of Advance Directive: Healthcare Power of Attorney Copy of advanced directive(s) in chart?: No - copy requested    Additional Information 1:1 In Past 12 Months?: No CIRT Risk: No Elopement Risk: No Does patient have medical clearance?: No     Disposition:  Disposition Initial Assessment Completed for this Encounter: Yes Disposition of Patient: Other dispositions Other disposition(s): Other (Comment) (Psych MD; Social work consult)  On Site Evaluation by:   Reviewed with Physician:    [place] 03/03/2016 5:50 AM

## 2016-03-03 NOTE — Discharge Instructions (Signed)
Dementia Dementia is a general term for problems with brain function. A person with dementia has memory loss and a hard time with at least one other brain function such as thinking, speaking, or problem solving. Dementia can affect social functioning, how you do your job, your mood, or your personality. The changes may be hidden for a long time. The earliest forms of this disease are usually not detected by family or friends. Dementia can be:  Irreversible.  Potentially reversible.  Partially reversible.  Progressive. This means it can get worse over time. CAUSES  Irreversible dementia causes may include:  Degeneration of brain cells (Alzheimer disease or Lewy body dementia).  Multiple small strokes (vascular dementia).  Infection (chronic meningitis or Creutzfeldt-Jakob disease).  Frontotemporal dementia. This affects younger people, age 75 to 63, compared to those who have Alzheimer disease.  Dementia associated with other disorders like Parkinson disease, Huntington disease, or HIV-associated dementia. Potentially or partially reversible dementia causes may include:  Medicines.  Metabolic causes such as excessive alcohol intake, vitamin B12 deficiency, or thyroid disease.  Masses or pressure in the brain such as a tumor, blood clot, or hydrocephalus. SIGNS AND SYMPTOMS  Symptoms are often hard to detect. Family members or coworkers may not notice them early in the disease process. Different people with dementia may have different symptoms. Symptoms can include:  A hard time with memory, especially recent memory. Long-term memory may not be impaired.  Asking the same question multiple times or forgetting something someone just said.  A hard time speaking your thoughts or finding certain words.  A hard time solving problems or performing familiar tasks (such as how to use a telephone).  Sudden changes in mood.  Changes in personality, especially increasing moodiness or  mistrust.  Depression.  A hard time understanding complex ideas that were never a problem in the past. DIAGNOSIS  There are no specific tests for dementia.   Your health care provider may recommend a thorough evaluation. This is because some forms of dementia can be reversible. The evaluation will likely include a physical exam and getting a detailed history from you and a family member. The history often gives the best clues and suggestions for a diagnosis.  Memory testing may be done. A detailed brain function evaluation called neuropsychologic testing may be helpful.  Lab tests and brain imaging (such as a CT scan or MRI scan) are sometimes important.  Sometimes observation and re-evaluation over time is very helpful. TREATMENT  Treatment depends on the cause.   If the problem is a vitamin deficiency, it may be helped or cured with supplements.  For dementias such as Alzheimer disease, medicines are available to stabilize or slow the course of the disease. There are no cures for this type of dementia.  Your health care provider can help direct you to groups, organizations, and other health care providers to help with decisions in the care of you or your loved one. HOME CARE INSTRUCTIONS The care of individuals with dementia is varied and dependent upon the progression of the dementia. The following suggestions are intended for the person living with, or caring for, the person with dementia.  Create a safe environment.  Remove the locks on bathroom doors to prevent the person from accidentally locking himself or herself in.  Use childproof latches on kitchen cabinets and any place where cleaning supplies, chemicals, or alcohol are kept.  Use childproof covers in unused electrical outlets.  Install childproof devices to keep doors and windows  secured.  Remove stove knobs or install safety knobs and an automatic shut-off on the stove.  Lower the temperature on water  heaters.  Label medicines and keep them locked up.  Secure knives, lighters, matches, power tools, and guns, and keep these items out of reach.  Keep the house free from clutter. Remove rugs or anything that might contribute to a fall.  Remove objects that might break and hurt the person.  Make sure lighting is good, both inside and outside.  Install grab rails as needed.  Use a monitoring device to alert you to falls or other needs for help.  Reduce confusion.  Keep familiar objects and people around.  Use night lights or dim lights at night.  Label items or areas.  Use reminders, notes, or directions for daily activities or tasks.  Keep a simple, consistent routine for waking, meals, bathing, dressing, and bedtime.  Create a calm, quiet environment.  Place large clocks and calendars prominently.  Display emergency numbers and home address near all telephones.  Use cues to establish different times of the day. An example is to open curtains to let the natural light in during the day.   Use effective communication.  Choose simple words and short sentences.  Use a gentle, calm tone of voice.  Be careful not to interrupt.  If the person is struggling to find a word or communicate a thought, try to provide the word or thought.  Ask one question at a time. Allow the person ample time to answer questions. Repeat the question again if the person does not respond.  Reduce nighttime restlessness.  Provide a comfortable bed.  Have a consistent nighttime routine.  Ensure a regular walking or physical activity schedule. Involve the person in daily activities as much as possible.  Limit napping during the day.  Limit caffeine.  Attend social events that stimulate rather than overwhelm the senses.  Encourage good nutrition and hydration.  Reduce distractions during meal times and snacks.  Avoid foods that are too hot or too cold.  Monitor chewing and swallowing  ability.  Continue with routine vision, hearing, dental, and medical screenings.  Give medicines only as directed by the health care provider.  Monitor driving abilities. Do not allow the person to drive when safe driving is no longer possible.  Register with an identification program which could provide location assistance in the event of a missing person situation. SEEK MEDICAL CARE IF:   New behavioral problems start such as moodiness, aggressiveness, or seeing things that are not there (hallucinations).  Any new problem with brain function happens. This includes problems with balance, speech, or falling a lot.  Problems with swallowing develop.  Any symptoms of other illness happen. Small changes or worsening in any aspect of brain function can be a sign that the illness is getting worse. It can also be a sign of another medical illness such as infection. Seeing a health care provider right away is important. SEEK IMMEDIATE MEDICAL CARE IF:   A fever develops.  New or worsened confusion develops.  New or worsened sleepiness develops.  Staying awake becomes hard to do.   This information is not intended to replace advice given to you by your health care provider. Make sure you discuss any questions you have with your health care provider.   Document Released: 05/23/2001 Document Revised: 12/18/2014 Document Reviewed: 04/24/2011 Elsevier Interactive Patient Education 2016 [place] Disease Alzheimer disease is a mental disorder. It causes  memory loss and loss of other mental functions, such as learning, thinking, problem solving, communicating, and completing tasks. The mental losses interfere with the ability to perform daily activities at work, at home, or in social situations. Alzheimer disease usually starts in a person's late 74s or early 66s but can start earlier in life (familial form). The mental changes caused by this disease are permanent and worsen  over time. As the illness progresses, the ability to do even the simplest things is lost. Survival with Alzheimer disease ranges from several years to as long as 20 years. CAUSES Alzheimer disease is caused by abnormally high levels of a protein (beta-amyloid) in the brain. This protein forms very small deposits within and around the brain's nerve cells. These deposits prevent the nerve cells from working properly. Experts are not certain what causes the beta-amyloid deposits in this disease. RISK FACTORS The following major risk factors have been identified:  Increasing age.  Certain genetic variations, such as Down syndrome (trisomy 21). SYMPTOMS In the early stages of Alzheimer disease, you are still able to perform daily activities but need greater effort, more time, or memory aids. Early symptoms include:  Mild memory loss of recent events, names, or phone numbers.  Loss of objects.  Minor loss of vocabulary.  Difficulty with complex tasks, such as paying bills or driving in unfamiliar locations. Other mental functions deteriorate as the disease worsens. These changes slowly go from mild to severe. Symptoms at this stage include:  Difficulty remembering. You may not be able to recall personal information such as your address and telephone number. You may become confused about the date, the season of the year, or your location.  Difficulty maintaining attention. You may forget what you wanted to say during conversations and repeat what you have already said.  Difficulty learning new information or tasks. You may not remember what you read or the name of a new friend you met.  Difficulty counting or doing math. You may have difficulty with complex math problems. You may make mistakes in paying bills or managing your checkbook.  Poor reasoning and judgment. You may make poor decisions or not dress right for the weather.  Difficulty communicating. You may have regular difficulty  remembering words, naming objects, expressing yourself clearly, or writing sentences that make sense.  Difficulty performing familiar daily activities. You may get lost driving in familiar locations or need help eating, bathing, dressing, grooming, or using the toilet. You may have difficulty maintaining bladder or bowel control.  Difficulty recognizing familiar faces. You may confuse family members or close friends with one another. You may not recognize a close relative or may mistake strangers for family. Alzheimer disease also may cause changes in personality and behavior. These changes include:   Loss of interest or motivation.  Social withdrawal.  Anxiety.  Difficulty sleeping.  Uncharacteristic anger or combativeness.  A false belief that someone is trying to harm you (paranoia).  Seeing things that are not real (hallucinations).  Agitation. Confusion and disruptive behavior are often worse at night and may be triggered by changes in the environment or acute medical issues. DIAGNOSIS  Alzheimer disease is diagnosed through an assessment by your health care provider. During this assessment, your health care provider will do the following:  Ask you and your family, friends, or caregivers questions about your symptoms, their frequency, their duration and progression, and the effect they are having on your life.  Ask questions about your personal and family medical history  and use of alcohol or drugs, including prescription medicine.  Perform a physical exam and order blood tests and brain imaging exams. Your health care provider may refer you to a specialist for detailed evaluation of your mental functions (neuropsychological testing).  Many different brain disorders, medical conditions, and certain substances can cause symptoms that resemble Alzheimer disease symptoms. These must be ruled out before this disease can be diagnosed. If Alzheimer disease is diagnosed, it will be  considered either "possible" or "probable" Alzheimer disease. "Possible" Alzheimer disease means that your symptoms are typical of the disease and no other disorder is causing them. "Probable" Alzheimer disease means that you also have a family history of the disease or genetic test results that support the diagnosis. Certain tests, mostly used in research studies, are highly specific for Alzheimer disease.  TREATMENT  There is currently no cure for this disease. The goals of treatment are to:  Slow down the progression of the disease.  Preserve mental function as long as possible.  Manage behavioral symptoms.  Make life easier for the person with Alzheimer disease and his or her caregivers. The following treatment options are available:  Medicine. Certain medicines may help slow memory loss by changing the level of certain chemicals in the brain. Medicine may also help with behavioral symptoms.  Talk therapy. Talk therapy provides education, support, and memory aids for people with this disease. It is most effective in the early stages of the illness.  Caregiving. Caregivers may be family members, friends, or trained medical professionals. They help the person with Alzheimer disease with daily life activities. Caregiving may take place at home or at a nursing facility.  Family support groups. These provide education, emotional support, and information about community resources to family members who are taking care of the person with this disease.   This information is not intended to replace advice given to you by your health care provider. Make sure you discuss any questions you have with your health care provider.   Document Released: 08/08/2004 Document Revised: 12/18/2014 Document Reviewed: 04/04/2013 Elsevier Interactive Patient Education [employer].

## 2016-03-03 NOTE — Progress Notes (Signed)
Called and left message for Nicholas Kennedy. Left message requesting a call back.  Phylliss Strege LCSW

## 2016-03-03 NOTE — ED Notes (Signed)
BEHAVIORAL HEALTH ROUNDING Patient sleeping: Yes.   Patient alert and oriented: eyes closed  Appears asleep Behavior appropriate: Yes.  ; If no, describe:  Nutrition and fluids offered: Yes  Toileting and hygiene offered: sleeping Sitter present: q 15 minute observations and security monitoring Law enforcement present: yes  ODS 

## 2016-03-03 NOTE — ED Notes (Signed)
BEHAVIORAL HEALTH ROUNDING Patient sleeping: Yes.   Patient alert and oriented: Sleeping Behavior appropriate: Yes.  ; If no, describe:  Nutrition and fluids offered: No Toileting and hygiene offered: No Sitter present: ED Rover Present Law enforcement present: Yes

## 2016-03-03 NOTE — ED Notes (Signed)
BEHAVIORAL HEALTH ROUNDING Patient sleeping: Yes.   Patient alert and oriented: pt asleep Behavior appropriate: Yes.  ; If no, describe:  Nutrition and fluids offered: No pt asleep Toileting and hygiene offered: No pt asleep Sitter present: ED Rover Present Law enforcement present: Yes

## 2016-03-03 NOTE — ED Notes (Signed)
Pt urinated self.  Pt clothes cleaned and changed by this nurse and Clinton SawyerKailey, RN.  Unable to get urine sample at this time.

## 2016-03-03 NOTE — ED Provider Notes (Signed)
-----------------------------------------   7:30 AM on 03/03/2016 -----------------------------------------   Blood pressure 121/67, pulse 59, temperature 97.3 F (36.3 C), temperature source Oral, resp. rate 16, height 5\' 9"  (1.753 m), weight 200 lb (90.719 kg), SpO2 96 %.  The patient had no acute events since last update.  Calm and cooperative at this time.  Disposition is pending per Psychiatry/Behavioral Medicine team recommendations.     Jeanmarie PlantJames A Lacresha Fusilier, MD 03/03/16 0730

## 2016-03-03 NOTE — Consult Note (Signed)
[place] [facility] Consult   Reason for Consult:  Consult for 75 year old man brought in from [place] because of reports of agitation Referring Physician:  [place] Patient Identification: Nicholas Kennedy MRN:  [id number] Principal Diagnosis: Alzheimer's dementia with behavioral disturbance Diagnosis:   Patient Active Problem List   Diagnosis Date Noted  . Episodic mood disorder ([place]) [F39] 11/09/2014  . Tremor [R25.1] 07/08/2014  . Alzheimer's dementia with behavioral disturbance [G30.8]   . Routine general medical examination at a health care facility [Z00.00] 10/11/2012  . Vitamin B12 deficiency [E53.8] 02/20/2011  . HYPERLIPIDEMIA [E78.5] 02/13/2008  . ALLERGIC RHINITIS [J30.9] 02/13/2008  . DIVERTICULOSIS, COLON [K57.30] 02/13/2008  . BPH (benign prostatic hypertrophy) [N40.0] 02/13/2008  . 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 ACTINIC KERATOSIS [L57.0] 02/13/2008    Total Time spent with patient: 45 minutes  Subjective:   Nicholas Kennedy is a 75 y.o. male patient admitted with patient did not offer any history. He was asleep when I came to interview him. I was able to wake him up but his speech was completely incoherent muttering.Marland Kitchen  HPI:  Chart reviewed. Attempted interview patient. As noted above he was not able to cooperate with the interview. He was asleep area when I woke him up by speaking his name multiple times and gently touching him on the shoulder he opened his eyes and attempted to speak but mostly monitored. I didn't understand anything he said and then he went back to sleep. Reports are that he has been aggressive at [place]. I don't have details right now. Daughter reportedly said that he has been sundowning and tends to get more agitated at night. So far no evidence of any acute medical problem. Evidently this is been a problem addressed in the past and he is supposed to be on medication.  Medical history: Multiple medical problems  including diverticulosis hyperlipidemia B12 deficiency.  Social history: Currently residing in [place]. Was recently at [facility] for inpatient treatment. Presumably just back there recently. Daughter appears to be most closely involved in his care.  Substance abuse history: Patient not able to give any information but there is no evidence of any current substance abuse issues or any information about any past problems in his chart.  Past Psychiatric History: Patient has a history of mood disorder but that is just mentioned in his chart and I was not able to locate clear description of it. No evidence of past psychiatric evaluation. He was hospitalized at [facility] for agitation recently presumably also from dementia. Currently seems to be still on olanzapine and Zoloft. Unknown if he's ever tried to hurt himself but doesn't appear to have done anything like that recently.  Risk to Self: Suicidal Ideation: No Suicidal Intent: No Is patient at risk for suicide?: No Suicidal Plan?: No Access to Means: No What has been your use of drugs/alcohol within the last 12 months?: None identified How many times?: 0 Other Self Harm Risks: None identified Triggers for Past Attempts: None known Intentional Self Injurious Behavior: None Risk to Others: Homicidal Ideation: No Thoughts of Harm to Others: No Current Homicidal Intent: No Current Homicidal Plan: No Access to Homicidal Means: No Identified Victim: None identified History of harm to others?: No Assessment of Violence: None Noted Violent Behavior Description: None identified Does patient have access to weapons?: No Criminal Charges Pending?: No Does patient have a court date: No Prior Inpatient Therapy: Prior Inpatient Therapy: Yes Prior Therapy Dates: 02/2016 Prior Therapy Facilty/Provider(s): [facility] Reason for Treatment: Dementia Prior Outpatient Therapy: Prior Outpatient Therapy: No Prior Therapy Dates:  N/A Prior Therapy Facilty/Provider(s): N/A Reason for Treatment: N/A Does patient have an ACCT team?: No Does patient have Intensive In-House Services?  : No Does patient have [facility] services? : No Does patient have P4CC services?: No  Past Medical History:  Past Medical History  Diagnosis Date  . Allergy   . Asthma   . Diverticulitis   . Hyperlipidemia   . Peyronie's disease   . ED (erectile dysfunction)   . BPH (benign prostatic hypertrophy)   . History of nephrolithiasis   . Alzheimer's dementia 2014    Past Surgical History  Procedure Laterality Date  . Tonsillectomy     Family History:  Family History  Problem Relation Age of Onset  . Alzheimer's disease Mother   . Alcohol abuse Father   . Cancer Father     prostate cancer  . Coronary artery disease Neg Hx   . Hypertension Neg Hx   . Diabetes Neg Hx    Family Psychiatric  History: No known information available right now about family history Social History:  History  Alcohol Use No     History  Drug Use No    Social History   Social History  . Marital Status: Widowed    Spouse Name: N/A  . Number of Children: 3  . Years of Education: N/A   Occupational History  . retired Water quality scientist   . farm beef cattle    Social History Main Topics  . Smoking status: Former Research scientist (life sciences)  . Smokeless tobacco: Never Used  . Alcohol Use: No  . Drug Use: No  . Sexual Activity: Not Asked   Other Topics Concern  . None   Social History Narrative   No living will   Daughter Knox Saliva, or son Sherren Mocha has health care POA   Would accept resuscitation   Hasn't thought about feeding tube   Additional Social History:    Allergies:   Allergies  Allergen Reactions  . Penicillins Other (See Comments)    Reaction: unknown Unknown answers to follow up questions    Labs:  Results for orders placed or performed during the hospital encounter of 03/02/16 (from the past 48 hour(s))  Comprehensive metabolic panel      Status: Abnormal   Collection Time: 03/02/16 10:09 PM  Result Value Ref Range   Sodium 138 135 - 145 mmol/L   Potassium 4.1 3.5 - 5.1 mmol/L   Chloride 107 101 - 111 mmol/L   CO2 26 22 - 32 mmol/L   Glucose, Bld 118 (H) 65 - 99 mg/dL   BUN 31 (H) 6 - 20 mg/dL   Creatinine, Ser 1.07 0.61 - 1.24 mg/dL   Calcium 8.3 (L) 8.9 - 10.3 mg/dL   Total Protein 6.7 6.5 - 8.1 g/dL   Albumin 3.9 3.5 - 5.0 g/dL   AST 27 15 - 41 U/L   ALT 26 17 - 63 U/L   Alkaline Phosphatase 73 38 - 126 U/L   Total Bilirubin 0.5 0.3 - 1.2 mg/dL   GFR calc non Af Amer >60 >60 mL/min   GFR calc Af Amer >60 >60 mL/min    Comment: (NOTE) The eGFR has been calculated using the CKD EPI equation. This calculation has not been validated in all clinical situations. eGFR's persistently <60 mL/min signify possible Chronic Kidney Disease.    Anion gap 5 5 - 15  Ethanol (ETOH)  Status: None   Collection Time: 03/02/16 10:09 PM  Result Value Ref Range   Alcohol, Ethyl (B) <5 <5 mg/dL    Comment:        LOWEST DETECTABLE LIMIT FOR SERUM ALCOHOL IS 5 mg/dL FOR MEDICAL PURPOSES ONLY   Salicylate level     Status: None   Collection Time: 03/02/16 10:09 PM  Result Value Ref Range   Salicylate Lvl <8.5 2.8 - 30.0 mg/dL  Acetaminophen level     Status: Abnormal   Collection Time: 03/02/16 10:09 PM  Result Value Ref Range   Acetaminophen (Tylenol), Serum <10 (L) 10 - 30 ug/mL    Comment:        THERAPEUTIC CONCENTRATIONS VARY SIGNIFICANTLY. A RANGE OF 10-30 ug/mL MAY BE AN EFFECTIVE CONCENTRATION FOR MANY PATIENTS. HOWEVER, SOME ARE BEST TREATED AT CONCENTRATIONS OUTSIDE THIS RANGE. ACETAMINOPHEN CONCENTRATIONS >150 ug/mL AT 4 HOURS AFTER INGESTION AND >50 ug/mL AT 12 HOURS AFTER INGESTION ARE OFTEN ASSOCIATED WITH TOXIC REACTIONS.   CBC     Status: Abnormal   Collection Time: 03/02/16 10:09 PM  Result Value Ref Range   WBC 6.6 3.8 - 10.6 K/uL   RBC 4.15 (L) 4.40 - 5.90 MIL/uL   Hemoglobin 12.7 (L) 13.0  - 18.0 g/dL   HCT 37.5 (L) 40.0 - 52.0 %   MCV 90.2 80.0 - 100.0 fL   MCH 30.6 26.0 - 34.0 pg   MCHC 33.9 32.0 - 36.0 g/dL   RDW 14.5 11.5 - 14.5 %   Platelets 135 (L) 150 - 440 K/uL    No current facility-administered medications for this encounter.   Current Outpatient Prescriptions  Medication Sig Dispense Refill  . divalproex (DEPAKOTE) 500 MG DR tablet Take 500 mg by mouth 2 (two) times daily.    Marland Kitchen doxazosin (CARDURA) 4 MG tablet Take 1 tablet (4 mg total) by mouth at bedtime. 90 tablet 3  . haloperidol (HALDOL) 0.5 MG tablet Take 0.5 mg by mouth 2 (two) times daily.    Marland Kitchen lisinopril (PRINIVIL,ZESTRIL) 5 MG tablet Take 5 mg by mouth daily.    Marland Kitchen LORazepam (ATIVAN) 0.5 MG tablet Take 0.5 mg by mouth 2 (two) times daily.     . memantine (NAMENDA) 5 MG tablet Take 5 mg by mouth 2 (two) times daily.    Marland Kitchen OLANZapine (ZYPREXA) 10 MG tablet Take 10 mg by mouth at bedtime.    . Omega-3 Fatty Acids (FISH OIL) 1000 MG CAPS Take 1 capsule by mouth 2 (two) times daily.    . psyllium (REGULOID) 0.52 g capsule Take 0.52 g by mouth daily.    . sertraline (ZOLOFT) 50 MG tablet TAKE 1 TABLET (50 MG TOTAL) BY MOUTH DAILY. 90 tablet 3  . traZODone (DESYREL) 100 MG tablet Take 100 mg by mouth at bedtime.      Musculoskeletal: Strength & Muscle Tone: decreased Gait & Station: unable to stand Patient leans: N/A  Psychiatric Specialty Exam: Review of Systems  Unable to perform ROS: mental acuity    Blood pressure 115/56, pulse 61, temperature 97.6 F (36.4 C), temperature source Oral, resp. rate 16, height _0  (1.753 m), weight 90.719 kg (200 lb), SpO2 97 %.Body mass index is 29.52 kg/(m^2).  General Appearance: Casual  Eye Contact::  Minimal  Speech:  Garbled and Slow  Volume:  Decreased  Mood:  Negative  Affect:  Constricted  Thought Process:  Disorganized  Orientation:  Negative  Thought Content:  Negative  Suicidal Thoughts:  No  Homicidal Thoughts:  No  Memory:  Immediate;    Poor Recent;   Poor Remote;   Poor  Judgement:  Impaired  Insight:  Lacking  Psychomotor Activity:  Psychomotor Retardation  Concentration:  Poor  Recall:  Poor  Fund of Knowledge:Poor  Language: Poor  Akathisia:  No  Handed:  Right  AIMS (if indicated):     Assets:  Social Support  ADL's:  Impaired  Cognition: Impaired,  Moderate and Severe  Sleep:      Treatment Plan Summary: Medication management and Plan Patient appears to be stable here. No signs of acute aggression. Labs unremarkable not really changed from last time. No indication that patient necessarily requires further inpatient psychiatric treatment or would benefit from it. We did contact [facility] and apparently they are full right now and not offering the possibility of a bed. At this time given the fact that he has a stable place to stay and that he is calm at the moment I would recommend he be discharged and go back to [facility]. Further follow-up with the providers they have available. With ER doctor. Discontinue IVC. He is reviewed with social work.  Disposition: Patient does not meet criteria for psychiatric inpatient admission.  Alethia Berthold, MD 03/03/2016 2:22 PM

## 2016-03-03 NOTE — Progress Notes (Signed)
LCSW called and spoke to Daughter phone number incorrect on TTS shift report- No [place]a concerns. It was explained that patient remains to be seen by psychiatrist, if he is cleared she will need to come and pick up her father. Pts reports he can return to [facility] if his medications are working and no longer aggressive. LCSW expressed that a memory care unit might be more suitable. LCSW agreed to call her later once patient assessed.

## 2016-03-03 NOTE — ED Notes (Signed)
Pt assisted back in bed by this tech

## 2016-03-03 NOTE — ED Notes (Signed)
Patient observed lying in bed with eyes closed  Even, unlabored respirations observed   NAD pt appears to be sleeping  I will continue to monitor along with every 15 minute visual observations and ongoing security monitoring    

## 2016-03-03 NOTE — ED Provider Notes (Signed)
-----------------------------------------   6:04 PM on 03/03/2016 -----------------------------------------   Blood pressure 115/56, pulse 61, temperature 97.6 F (36.4 C), temperature source Oral, resp. rate 16, height 5\' 9"  (1.753 m), weight 200 lb (90.719 kg), SpO2 97 %.  The patient had no acute events since last update.  Calm and cooperative at this time.  Case discussed with Dr. Toni Amendlapacs of psychiatry who feels the patient is psychiatrically stable and suitable for outpatient follow up with [facility]. Dr. Toni Amendlapacs rescinded the involuntary commitment petition. Patient remains medically stable and will be discharged home.   Sharman CheekPhillip Shamel Germond, MD 03/03/16 [phone]

## 2016-03-03 NOTE — ED Notes (Signed)
Pt escorted back to bed by this tech.  

## 2016-03-03 NOTE — ED Notes (Signed)

## 2016-03-03 NOTE — ED Notes (Signed)
Breakfast provided  - Patient continues to be observed lying in bed with eyes closed  Even, unlabored respirations observed   NAD pt appears to be sleeping  I will continue to monitor along with every 15 minute visual observations and ongoing security monitoring

## 2016-03-03 NOTE — ED Notes (Signed)
BEHAVIORAL HEALTH ROUNDING Patient sleeping: Yes.   Patient alert and oriented: pt asleep Behavior appropriate: Yes.  ; If no, describe:  Nutrition and fluids offered: No Toileting and hygiene offered: No Sitter present: ED Rover Present Law enforcement present: Yes   

## 2016-03-03 NOTE — Progress Notes (Signed)
Called Nicholas Kennedy [phone] and she is not able to take her father home under any circumstance. She reported [facility] wont take him either/ they did not give daughter 30 day notice.Marland Kitchen. LCSW called [facility] and spoke to [employer]- Administrative Assistant and she reported that he can be sent via EMS. LCSW notified ED nurse and EDP that patient can return to [facility]. Trinady Milewski LCSW

## 2016-03-03 NOTE — ED Notes (Signed)
Spoke with pts daughter  Nicholas Kennedy [phone] - POA  Gave an update and informed her of [name] they requested that we call back Monday  Family updated and reassured

## 2016-03-07 DIAGNOSIS — F329 Major depressive disorder, single episode, unspecified: Secondary | ICD-10-CM | POA: Diagnosis not present

## 2016-03-07 DIAGNOSIS — F0281 Dementia in other diseases classified elsewhere with behavioral disturbance: Secondary | ICD-10-CM | POA: Diagnosis not present

## 2016-03-07 DIAGNOSIS — G309 Alzheimer's disease, unspecified: Secondary | ICD-10-CM | POA: Diagnosis not present

## 2016-03-07 DIAGNOSIS — I10 Essential (primary) hypertension: Secondary | ICD-10-CM | POA: Diagnosis not present

## 2016-03-07 DIAGNOSIS — F419 Anxiety disorder, unspecified: Secondary | ICD-10-CM | POA: Diagnosis not present

## 2016-03-09 DIAGNOSIS — N400 Enlarged prostate without lower urinary tract symptoms: Secondary | ICD-10-CM | POA: Diagnosis not present

## 2016-03-09 DIAGNOSIS — E039 Hypothyroidism, unspecified: Secondary | ICD-10-CM | POA: Diagnosis not present

## 2016-03-09 DIAGNOSIS — Z008 Encounter for other general examination: Secondary | ICD-10-CM | POA: Diagnosis not present

## 2016-03-09 DIAGNOSIS — R451 Restlessness and agitation: Secondary | ICD-10-CM | POA: Diagnosis not present

## 2016-03-09 DIAGNOSIS — R600 Localized edema: Secondary | ICD-10-CM | POA: Diagnosis not present

## 2016-03-09 DIAGNOSIS — N486 Induration penis plastica: Secondary | ICD-10-CM | POA: Diagnosis not present

## 2016-03-09 DIAGNOSIS — G309 Alzheimer's disease, unspecified: Secondary | ICD-10-CM | POA: Diagnosis not present

## 2016-03-09 DIAGNOSIS — Z880 Allergy status to penicillin: Secondary | ICD-10-CM | POA: Diagnosis not present

## 2016-03-09 DIAGNOSIS — Z9183 Wandering in diseases classified elsewhere: Secondary | ICD-10-CM | POA: Diagnosis not present

## 2016-03-09 DIAGNOSIS — D649 Anemia, unspecified: Secondary | ICD-10-CM | POA: Diagnosis not present

## 2016-03-09 DIAGNOSIS — F0281 Dementia in other diseases classified elsewhere with behavioral disturbance: Secondary | ICD-10-CM | POA: Diagnosis not present

## 2016-03-09 DIAGNOSIS — Z87891 Personal history of nicotine dependence: Secondary | ICD-10-CM | POA: Diagnosis not present

## 2016-03-09 DIAGNOSIS — Z79899 Other long term (current) drug therapy: Secondary | ICD-10-CM | POA: Diagnosis not present

## 2016-03-09 DIAGNOSIS — E785 Hyperlipidemia, unspecified: Secondary | ICD-10-CM | POA: Diagnosis not present

## 2016-03-09 DIAGNOSIS — F0391 Unspecified dementia with behavioral disturbance: Secondary | ICD-10-CM | POA: Diagnosis not present

## 2016-03-09 DIAGNOSIS — K5901 Slow transit constipation: Secondary | ICD-10-CM | POA: Diagnosis not present

## 2016-03-09 DIAGNOSIS — J45909 Unspecified asthma, uncomplicated: Secondary | ICD-10-CM | POA: Diagnosis not present

## 2016-03-09 DIAGNOSIS — I10 Essential (primary) hypertension: Secondary | ICD-10-CM | POA: Diagnosis not present

## 2016-03-09 DIAGNOSIS — R7303 Prediabetes: Secondary | ICD-10-CM | POA: Diagnosis not present

## 2016-03-10 DIAGNOSIS — I10 Essential (primary) hypertension: Secondary | ICD-10-CM | POA: Diagnosis not present

## 2016-03-10 DIAGNOSIS — N400 Enlarged prostate without lower urinary tract symptoms: Secondary | ICD-10-CM | POA: Diagnosis not present

## 2016-03-10 DIAGNOSIS — M7989 Other specified soft tissue disorders: Secondary | ICD-10-CM | POA: Diagnosis not present

## 2016-03-10 DIAGNOSIS — F0281 Dementia in other diseases classified elsewhere with behavioral disturbance: Secondary | ICD-10-CM | POA: Diagnosis not present

## 2016-03-10 DIAGNOSIS — R451 Restlessness and agitation: Secondary | ICD-10-CM | POA: Diagnosis not present

## 2016-03-10 DIAGNOSIS — G308 Other Alzheimer's disease: Secondary | ICD-10-CM | POA: Diagnosis not present

## 2016-03-10 DIAGNOSIS — F0391 Unspecified dementia with behavioral disturbance: Secondary | ICD-10-CM | POA: Diagnosis not present

## 2016-03-11 DIAGNOSIS — E039 Hypothyroidism, unspecified: Secondary | ICD-10-CM | POA: Diagnosis not present

## 2016-03-11 DIAGNOSIS — F0281 Dementia in other diseases classified elsewhere with behavioral disturbance: Secondary | ICD-10-CM | POA: Diagnosis not present

## 2016-03-11 DIAGNOSIS — G308 Other Alzheimer's disease: Secondary | ICD-10-CM | POA: Diagnosis not present

## 2016-03-11 DIAGNOSIS — I10 Essential (primary) hypertension: Secondary | ICD-10-CM | POA: Diagnosis not present

## 2016-03-12 DIAGNOSIS — G308 Other Alzheimer's disease: Secondary | ICD-10-CM | POA: Diagnosis not present

## 2016-03-12 DIAGNOSIS — F0281 Dementia in other diseases classified elsewhere with behavioral disturbance: Secondary | ICD-10-CM | POA: Diagnosis not present

## 2016-03-13 DIAGNOSIS — J45909 Unspecified asthma, uncomplicated: Secondary | ICD-10-CM | POA: Diagnosis not present

## 2016-03-13 DIAGNOSIS — N400 Enlarged prostate without lower urinary tract symptoms: Secondary | ICD-10-CM | POA: Diagnosis not present

## 2016-03-13 DIAGNOSIS — F0281 Dementia in other diseases classified elsewhere with behavioral disturbance: Secondary | ICD-10-CM | POA: Diagnosis not present

## 2016-03-13 DIAGNOSIS — I10 Essential (primary) hypertension: Secondary | ICD-10-CM | POA: Diagnosis not present

## 2016-03-13 DIAGNOSIS — E782 Mixed hyperlipidemia: Secondary | ICD-10-CM | POA: Diagnosis not present

## 2016-03-13 DIAGNOSIS — G308 Other Alzheimer's disease: Secondary | ICD-10-CM | POA: Diagnosis not present

## 2016-03-14 DIAGNOSIS — I10 Essential (primary) hypertension: Secondary | ICD-10-CM | POA: Diagnosis not present

## 2016-03-14 DIAGNOSIS — F0391 Unspecified dementia with behavioral disturbance: Secondary | ICD-10-CM | POA: Diagnosis not present

## 2016-03-14 DIAGNOSIS — F0281 Dementia in other diseases classified elsewhere with behavioral disturbance: Secondary | ICD-10-CM | POA: Diagnosis not present

## 2016-03-14 DIAGNOSIS — N400 Enlarged prostate without lower urinary tract symptoms: Secondary | ICD-10-CM | POA: Diagnosis not present

## 2016-03-14 DIAGNOSIS — M7989 Other specified soft tissue disorders: Secondary | ICD-10-CM | POA: Diagnosis not present

## 2016-03-14 DIAGNOSIS — G308 Other Alzheimer's disease: Secondary | ICD-10-CM | POA: Diagnosis not present

## 2016-03-15 DIAGNOSIS — F0391 Unspecified dementia with behavioral disturbance: Secondary | ICD-10-CM | POA: Diagnosis not present

## 2016-03-16 DIAGNOSIS — N400 Enlarged prostate without lower urinary tract symptoms: Secondary | ICD-10-CM | POA: Diagnosis not present

## 2016-03-16 DIAGNOSIS — F0281 Dementia in other diseases classified elsewhere with behavioral disturbance: Secondary | ICD-10-CM | POA: Diagnosis not present

## 2016-03-16 DIAGNOSIS — F0391 Unspecified dementia with behavioral disturbance: Secondary | ICD-10-CM | POA: Diagnosis not present

## 2016-03-16 DIAGNOSIS — M7989 Other specified soft tissue disorders: Secondary | ICD-10-CM | POA: Diagnosis not present

## 2016-03-16 DIAGNOSIS — I10 Essential (primary) hypertension: Secondary | ICD-10-CM | POA: Diagnosis not present

## 2016-03-16 DIAGNOSIS — G308 Other Alzheimer's disease: Secondary | ICD-10-CM | POA: Diagnosis not present

## 2016-03-16 DIAGNOSIS — R451 Restlessness and agitation: Secondary | ICD-10-CM | POA: Diagnosis not present

## 2016-03-17 DIAGNOSIS — F0391 Unspecified dementia with behavioral disturbance: Secondary | ICD-10-CM | POA: Diagnosis not present

## 2016-03-18 DIAGNOSIS — F0281 Dementia in other diseases classified elsewhere with behavioral disturbance: Secondary | ICD-10-CM | POA: Diagnosis not present

## 2016-03-18 DIAGNOSIS — M7989 Other specified soft tissue disorders: Secondary | ICD-10-CM | POA: Diagnosis not present

## 2016-03-18 DIAGNOSIS — F0391 Unspecified dementia with behavioral disturbance: Secondary | ICD-10-CM | POA: Diagnosis not present

## 2016-03-18 DIAGNOSIS — G308 Other Alzheimer's disease: Secondary | ICD-10-CM | POA: Diagnosis not present

## 2016-03-18 DIAGNOSIS — I10 Essential (primary) hypertension: Secondary | ICD-10-CM | POA: Diagnosis not present

## 2016-03-18 DIAGNOSIS — N400 Enlarged prostate without lower urinary tract symptoms: Secondary | ICD-10-CM | POA: Diagnosis not present

## 2016-03-19 DIAGNOSIS — F0391 Unspecified dementia with behavioral disturbance: Secondary | ICD-10-CM | POA: Diagnosis not present

## 2016-03-20 DIAGNOSIS — F0281 Dementia in other diseases classified elsewhere with behavioral disturbance: Secondary | ICD-10-CM | POA: Diagnosis not present

## 2016-03-20 DIAGNOSIS — G308 Other Alzheimer's disease: Secondary | ICD-10-CM | POA: Diagnosis not present

## 2016-03-20 DIAGNOSIS — I10 Essential (primary) hypertension: Secondary | ICD-10-CM | POA: Diagnosis not present

## 2016-03-20 DIAGNOSIS — N400 Enlarged prostate without lower urinary tract symptoms: Secondary | ICD-10-CM | POA: Diagnosis not present

## 2016-03-20 DIAGNOSIS — M7989 Other specified soft tissue disorders: Secondary | ICD-10-CM | POA: Diagnosis not present

## 2016-03-21 DIAGNOSIS — F0391 Unspecified dementia with behavioral disturbance: Secondary | ICD-10-CM | POA: Diagnosis not present

## 2016-03-22 DIAGNOSIS — J45909 Unspecified asthma, uncomplicated: Secondary | ICD-10-CM | POA: Diagnosis not present

## 2016-03-22 DIAGNOSIS — F0391 Unspecified dementia with behavioral disturbance: Secondary | ICD-10-CM | POA: Diagnosis not present

## 2016-03-22 DIAGNOSIS — N400 Enlarged prostate without lower urinary tract symptoms: Secondary | ICD-10-CM | POA: Diagnosis not present

## 2016-03-22 DIAGNOSIS — I10 Essential (primary) hypertension: Secondary | ICD-10-CM | POA: Diagnosis not present

## 2016-03-23 DIAGNOSIS — J45909 Unspecified asthma, uncomplicated: Secondary | ICD-10-CM | POA: Diagnosis not present

## 2016-03-23 DIAGNOSIS — F0281 Dementia in other diseases classified elsewhere with behavioral disturbance: Secondary | ICD-10-CM | POA: Diagnosis not present

## 2016-03-23 DIAGNOSIS — N400 Enlarged prostate without lower urinary tract symptoms: Secondary | ICD-10-CM | POA: Diagnosis not present

## 2016-03-23 DIAGNOSIS — F0391 Unspecified dementia with behavioral disturbance: Secondary | ICD-10-CM | POA: Diagnosis not present

## 2016-03-23 DIAGNOSIS — G308 Other Alzheimer's disease: Secondary | ICD-10-CM | POA: Diagnosis not present

## 2016-03-23 DIAGNOSIS — R451 Restlessness and agitation: Secondary | ICD-10-CM | POA: Diagnosis not present

## 2016-03-23 DIAGNOSIS — I10 Essential (primary) hypertension: Secondary | ICD-10-CM | POA: Diagnosis not present

## 2016-03-24 DIAGNOSIS — J45909 Unspecified asthma, uncomplicated: Secondary | ICD-10-CM | POA: Diagnosis not present

## 2016-03-24 DIAGNOSIS — R451 Restlessness and agitation: Secondary | ICD-10-CM | POA: Diagnosis not present

## 2016-03-24 DIAGNOSIS — N400 Enlarged prostate without lower urinary tract symptoms: Secondary | ICD-10-CM | POA: Diagnosis not present

## 2016-03-24 DIAGNOSIS — F0391 Unspecified dementia with behavioral disturbance: Secondary | ICD-10-CM | POA: Diagnosis not present

## 2016-03-25 DIAGNOSIS — F0281 Dementia in other diseases classified elsewhere with behavioral disturbance: Secondary | ICD-10-CM | POA: Diagnosis not present

## 2016-03-25 DIAGNOSIS — J45909 Unspecified asthma, uncomplicated: Secondary | ICD-10-CM | POA: Diagnosis not present

## 2016-03-25 DIAGNOSIS — G308 Other Alzheimer's disease: Secondary | ICD-10-CM | POA: Diagnosis not present

## 2016-03-25 DIAGNOSIS — N400 Enlarged prostate without lower urinary tract symptoms: Secondary | ICD-10-CM | POA: Diagnosis not present

## 2016-03-26 DIAGNOSIS — I10 Essential (primary) hypertension: Secondary | ICD-10-CM | POA: Diagnosis not present

## 2016-03-26 DIAGNOSIS — G308 Other Alzheimer's disease: Secondary | ICD-10-CM | POA: Diagnosis not present

## 2016-03-26 DIAGNOSIS — K5901 Slow transit constipation: Secondary | ICD-10-CM | POA: Diagnosis not present

## 2016-03-26 DIAGNOSIS — J45909 Unspecified asthma, uncomplicated: Secondary | ICD-10-CM | POA: Diagnosis not present

## 2016-03-26 DIAGNOSIS — N400 Enlarged prostate without lower urinary tract symptoms: Secondary | ICD-10-CM | POA: Diagnosis not present

## 2016-03-26 DIAGNOSIS — F0281 Dementia in other diseases classified elsewhere with behavioral disturbance: Secondary | ICD-10-CM | POA: Diagnosis not present

## 2016-03-27 DIAGNOSIS — F0281 Dementia in other diseases classified elsewhere with behavioral disturbance: Secondary | ICD-10-CM | POA: Diagnosis not present

## 2016-03-27 DIAGNOSIS — K5901 Slow transit constipation: Secondary | ICD-10-CM | POA: Diagnosis not present

## 2016-03-27 DIAGNOSIS — J45909 Unspecified asthma, uncomplicated: Secondary | ICD-10-CM | POA: Diagnosis not present

## 2016-03-27 DIAGNOSIS — I10 Essential (primary) hypertension: Secondary | ICD-10-CM | POA: Diagnosis not present

## 2016-03-27 DIAGNOSIS — G308 Other Alzheimer's disease: Secondary | ICD-10-CM | POA: Diagnosis not present

## 2016-03-27 DIAGNOSIS — N400 Enlarged prostate without lower urinary tract symptoms: Secondary | ICD-10-CM | POA: Diagnosis not present

## 2016-03-28 DIAGNOSIS — F0391 Unspecified dementia with behavioral disturbance: Secondary | ICD-10-CM | POA: Diagnosis not present

## 2016-03-29 DIAGNOSIS — F0391 Unspecified dementia with behavioral disturbance: Secondary | ICD-10-CM | POA: Diagnosis not present

## 2016-03-30 DIAGNOSIS — F0391 Unspecified dementia with behavioral disturbance: Secondary | ICD-10-CM | POA: Diagnosis not present

## 2016-03-30 DIAGNOSIS — D649 Anemia, unspecified: Secondary | ICD-10-CM | POA: Diagnosis not present

## 2016-03-30 DIAGNOSIS — N400 Enlarged prostate without lower urinary tract symptoms: Secondary | ICD-10-CM | POA: Diagnosis not present

## 2016-03-30 DIAGNOSIS — I10 Essential (primary) hypertension: Secondary | ICD-10-CM | POA: Diagnosis not present

## 2016-03-30 DIAGNOSIS — K5901 Slow transit constipation: Secondary | ICD-10-CM | POA: Diagnosis not present

## 2016-03-30 DIAGNOSIS — R451 Restlessness and agitation: Secondary | ICD-10-CM | POA: Diagnosis not present

## 2016-03-31 DIAGNOSIS — F0391 Unspecified dementia with behavioral disturbance: Secondary | ICD-10-CM | POA: Diagnosis not present

## 2016-04-01 DIAGNOSIS — I10 Essential (primary) hypertension: Secondary | ICD-10-CM | POA: Diagnosis not present

## 2016-04-01 DIAGNOSIS — N400 Enlarged prostate without lower urinary tract symptoms: Secondary | ICD-10-CM | POA: Diagnosis not present

## 2016-04-01 DIAGNOSIS — R451 Restlessness and agitation: Secondary | ICD-10-CM | POA: Diagnosis not present

## 2016-04-01 DIAGNOSIS — F0391 Unspecified dementia with behavioral disturbance: Secondary | ICD-10-CM | POA: Diagnosis not present

## 2016-04-03 DIAGNOSIS — F0391 Unspecified dementia with behavioral disturbance: Secondary | ICD-10-CM | POA: Diagnosis not present

## 2016-04-04 DIAGNOSIS — F0281 Dementia in other diseases classified elsewhere with behavioral disturbance: Secondary | ICD-10-CM | POA: Diagnosis not present

## 2016-04-04 DIAGNOSIS — E039 Hypothyroidism, unspecified: Secondary | ICD-10-CM | POA: Diagnosis not present

## 2016-04-04 DIAGNOSIS — N400 Enlarged prostate without lower urinary tract symptoms: Secondary | ICD-10-CM | POA: Diagnosis not present

## 2016-04-04 DIAGNOSIS — G308 Other Alzheimer's disease: Secondary | ICD-10-CM | POA: Diagnosis not present

## 2016-04-04 DIAGNOSIS — E785 Hyperlipidemia, unspecified: Secondary | ICD-10-CM | POA: Diagnosis not present

## 2016-04-09 ENCOUNTER — Emergency Department (HOSPITAL_COMMUNITY)
Admission: EM | Admit: 2016-04-09 | Discharge: 2016-04-20 | Disposition: A | Discharge status: Other Acute Inpatient Hospital | Payer: PPO | Attending: Emergency Medicine | Admitting: Emergency Medicine

## 2016-04-09 ENCOUNTER — Emergency Department (HOSPITAL_COMMUNITY): Payer: PPO

## 2016-04-09 ENCOUNTER — Encounter (HOSPITAL_COMMUNITY): Payer: Self-pay | Admitting: Emergency Medicine

## 2016-04-09 DIAGNOSIS — J45909 Unspecified asthma, uncomplicated: Secondary | ICD-10-CM | POA: Insufficient documentation

## 2016-04-09 DIAGNOSIS — F1310 Sedative, hypnotic or anxiolytic abuse, uncomplicated: Secondary | ICD-10-CM | POA: Insufficient documentation

## 2016-04-09 DIAGNOSIS — Z880 Allergy status to penicillin: Secondary | ICD-10-CM | POA: Insufficient documentation

## 2016-04-09 DIAGNOSIS — G309 Alzheimer's disease, unspecified: Secondary | ICD-10-CM | POA: Diagnosis not present

## 2016-04-09 DIAGNOSIS — N400 Enlarged prostate without lower urinary tract symptoms: Secondary | ICD-10-CM | POA: Insufficient documentation

## 2016-04-09 DIAGNOSIS — Z8719 Personal history of other diseases of the digestive system: Secondary | ICD-10-CM | POA: Diagnosis not present

## 2016-04-09 DIAGNOSIS — F919 Conduct disorder, unspecified: Secondary | ICD-10-CM | POA: Insufficient documentation

## 2016-04-09 DIAGNOSIS — R918 Other nonspecific abnormal finding of lung field: Secondary | ICD-10-CM | POA: Diagnosis not present

## 2016-04-09 DIAGNOSIS — Z87891 Personal history of nicotine dependence: Secondary | ICD-10-CM | POA: Insufficient documentation

## 2016-04-09 DIAGNOSIS — F0391 Unspecified dementia with behavioral disturbance: Secondary | ICD-10-CM | POA: Diagnosis not present

## 2016-04-09 DIAGNOSIS — F0281 Dementia in other diseases classified elsewhere with behavioral disturbance: Secondary | ICD-10-CM | POA: Insufficient documentation

## 2016-04-09 DIAGNOSIS — Z87442 Personal history of urinary calculi: Secondary | ICD-10-CM | POA: Insufficient documentation

## 2016-04-09 DIAGNOSIS — F989 Unspecified behavioral and emotional disorders with onset usually occurring in childhood and adolescence: Secondary | ICD-10-CM | POA: Diagnosis not present

## 2016-04-09 DIAGNOSIS — Z79899 Other long term (current) drug therapy: Secondary | ICD-10-CM | POA: Diagnosis not present

## 2016-04-09 DIAGNOSIS — F02818 Dementia in other diseases classified elsewhere, unspecified severity, with other behavioral disturbance: Secondary | ICD-10-CM | POA: Diagnosis present

## 2016-04-09 DIAGNOSIS — Z008 Encounter for other general examination: Secondary | ICD-10-CM | POA: Diagnosis not present

## 2016-04-09 DIAGNOSIS — E785 Hyperlipidemia, unspecified: Secondary | ICD-10-CM | POA: Insufficient documentation

## 2016-04-09 DIAGNOSIS — IMO0002 Reserved for concepts with insufficient information to code with codable children: Secondary | ICD-10-CM

## 2016-04-09 LAB — CBC WITH DIFFERENTIAL/PLATELET
BASOS ABS: 0.1 10*3/uL (ref 0.0–0.1)
Basophils Relative: 1 %
EOS ABS: 0.3 10*3/uL (ref 0.0–0.7)
Eosinophils Relative: 4 %
HCT: 34.9 % — ABNORMAL LOW (ref 39.0–52.0)
HEMOGLOBIN: 11.8 g/dL — AB (ref 13.0–17.0)
LYMPHS ABS: 1.3 10*3/uL (ref 0.7–4.0)
Lymphocytes Relative: 21 %
MCH: 31.3 pg (ref 26.0–34.0)
MCHC: 33.8 g/dL (ref 30.0–36.0)
MCV: 92.6 fL (ref 78.0–100.0)
Monocytes Absolute: 0.5 10*3/uL (ref 0.1–1.0)
Monocytes Relative: 9 %
NEUTROS PCT: 65 %
Neutro Abs: 3.9 10*3/uL (ref 1.7–7.7)
Platelets: 176 10*3/uL (ref 150–400)
RBC: 3.77 MIL/uL — AB (ref 4.22–5.81)
RDW: 14.8 % (ref 11.5–15.5)
WBC: 6.1 10*3/uL (ref 4.0–10.5)

## 2016-04-09 LAB — BASIC METABOLIC PANEL
ANION GAP: 7 (ref 5–15)
BUN: 18 mg/dL (ref 6–20)
CHLORIDE: 109 mmol/L (ref 101–111)
CO2: 26 mmol/L (ref 22–32)
Calcium: 8.9 mg/dL (ref 8.9–10.3)
Creatinine, Ser: 0.99 mg/dL (ref 0.61–1.24)
GFR calc non Af Amer: >60 mL/min (ref >60)
Glucose, Bld: 120 mg/dL — ABNORMAL HIGH (ref 65–99)
POTASSIUM: 3.5 mmol/L (ref 3.5–5.1)
SODIUM: 142 mmol/L (ref 135–145)

## 2016-04-09 MED ORDER — ONDANSETRON HCL 4 MG PO TABS: 4.0000 mg | ORAL_TABLET | Freq: Three times a day (TID) | ORAL | Status: DC | PRN

## 2016-04-09 MED ORDER — ZIPRASIDONE MESYLATE 20 MG IM SOLR
10.0000 mg | Freq: Once | INTRAMUSCULAR | Status: AC
  Administered 2016-04-09: 10 mg via INTRAMUSCULAR
  Filled 2016-04-09: qty 20

## 2016-04-09 MED ORDER — STERILE WATER FOR INJECTION IJ SOLN
INTRAMUSCULAR | Status: AC
  Administered 2016-04-09: 1.2 mL
  Filled 2016-04-09: qty 10

## 2016-04-09 MED ORDER — ZOLPIDEM TARTRATE 5 MG PO TABS
5.0000 mg | ORAL_TABLET | Freq: Every evening | ORAL | Status: DC | PRN
  Administered 2016-04-10 – 2016-04-16 (×8): 5 mg via ORAL
  Filled 2016-04-09 (×10): qty 1

## 2016-04-09 MED ORDER — LORAZEPAM 1 MG PO TABS
1.0000 mg | ORAL_TABLET | Freq: Three times a day (TID) | ORAL | Status: DC | PRN
Start: 2016-04-09 — End: 2016-04-14
  Administered 2016-04-09 – 2016-04-13 (×9): 1 mg via ORAL
  Filled 2016-04-09 (×10): qty 1

## 2016-04-09 MED ORDER — ZIPRASIDONE MESYLATE 20 MG IM SOLR
10.0000 mg | INTRAMUSCULAR | Status: AC | PRN
Start: 2016-04-09 — End: 2016-04-11
  Administered 2016-04-09 – 2016-04-10 (×2): 10 mg via INTRAMUSCULAR
  Administered 2016-04-11: 20 mg via INTRAMUSCULAR
  Filled 2016-04-09 (×3): qty 20

## 2016-04-09 MED ORDER — NICOTINE 21 MG/24HR TD PT24: 21.0000 mg | MEDICATED_PATCH | Freq: Every day | TRANSDERMAL | Status: DC
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
## 2016-04-09 NOTE — [facility] Counselor (Signed)
[facility] # [id number]
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
## 2016-04-09 NOTE — ED Notes (Signed)
Patient becoming agitated attempting with difficulty to remove his clothing.  Agitation increased when I approached.

## 2016-04-09 NOTE — ED Notes (Signed)
Geodon given.

## 2016-04-10 MED ORDER — STERILE WATER FOR INJECTION IJ SOLN
INTRAMUSCULAR | Status: AC
  Administered 2016-04-10: 10 mL
  Filled 2016-04-10: qty 10
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
## 2016-04-10 NOTE — ED Notes (Signed)
Ordered patient breakfast

## 2016-04-10 NOTE — Progress Notes (Signed)
Per [facility] intake Onalee Huaavid, referral was not found. CSW will continue to follow up and refer [facility].  Melbourne Abtsatia Juanya Villavicencio, LCSWA Disposition staff 04/10/2016 10:44 AM

## 2016-04-10 NOTE — ED Notes (Signed)
Pt standing at end of bed becomes aggitated and swings at staff when they try to redirect him and keep him from falling. Charge nurse aware of aggitation and attempts to get out of bed but no safety sitters available.

## 2016-04-10 NOTE — ED Notes (Signed)
Assisted complete bed change. PT is agitated and removed Posey sensor pad along with bedding.

## 2016-04-10 NOTE — ED Notes (Addendum)
Meal tray at bedside.  

## 2016-04-10 NOTE — ED Notes (Signed)
Pt. eating his supper with EMT's assistance .

## 2016-04-10 NOTE — ED Notes (Signed)
Pt is not oriented , has mumbled speech and does not make sense when replys to command or question. Does not follow command. MAEW resp even and non labored.

## 2016-04-10 NOTE — ED Notes (Signed)
Pt sleeping with snoring respirations. Pt watched by staff at nursing stations.

## 2016-04-10 NOTE — ED Notes (Signed)
Pt oob and redirected back to bed after cleaning bottom and changing linen.

## 2016-04-11 MED ORDER — STERILE WATER FOR INJECTION IJ SOLN
INTRAMUSCULAR | Status: AC
  Filled 2016-04-11: qty 10

## 2016-04-11 MED ORDER — ZIPRASIDONE MESYLATE 20 MG IM SOLR
10.0000 mg | INTRAMUSCULAR | Status: DC | PRN
  Administered 2016-04-12 – 2016-04-14 (×3): 10 mg via INTRAMUSCULAR
  Filled 2016-04-11 (×3): qty 20

## 2016-04-11 MED ORDER — HALOPERIDOL LACTATE 5 MG/ML IJ SOLN
2.0000 mg | Freq: Once | INTRAMUSCULAR | Status: AC
  Administered 2016-04-11: 2 mg via INTRAMUSCULAR
  Filled 2016-04-11: qty 1

## 2016-04-11 MED ORDER — LORAZEPAM 2 MG/ML IJ SOLN
0.5000 mg | Freq: Once | INTRAMUSCULAR | Status: DC
  Filled 2016-04-11 (×2): qty 1

## 2016-04-11 NOTE — Progress Notes (Signed)
CSW engaged with Patient's daughter, Marita Kansasara Pitts [phone]), at Patient's bedside. Patient currently sleeping. CSW informed Patient's daughter that [facility] and CSW is unable to provide an estimate of when [facility]. Patient's daughter reports that she would like for Patient to go to a longer term psych facility such as [facility] as opposed to a short term psych facility such as [facility]. She reports that he has been to 3 different nursing home facilities and been to [facility] twice with no improvement in behaviors. Patient's daughter requests to be updated as they become available. CSW will continue to follow for disposition.      Lance MussAshley Gardner,MSW, LCSW [facility] ED/75M Clinical Social Worker [phone]
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
## 2016-04-11 NOTE — ED Notes (Signed)
Leola Brazilara Pitt, pts daughter is at bedside, Social Worker to speak with the pt re: the pts plan of care for placement, pt to be contacted @ [phone] with updates
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
## 2016-04-11 NOTE — ED Notes (Signed)
Pt out of bed, pt continues to take off clothes, pt in cabinets, NT at bedside, Charge RN aware

## 2016-04-11 NOTE — ED Notes (Signed)
Patient repositioned to be able to eat dinner.  Sitter at bedside

## 2016-04-11 NOTE — ED Notes (Signed)
Pt placed on O2 continuous monitoring, pt placed on Cardiac monitor, Rancour, MD aware pt rcvd 20 mg Geodon

## 2016-04-11 NOTE — ED Notes (Signed)
Patient took attends off and tossed it at the sitter.

## 2016-04-11 NOTE — ED Notes (Signed)
Pt grabbed medication syringe out of this RNS hand when administering IM Haldol, Sharlet SalinaBenjamin RN assisted with retrieving syringe, no injury occurred to pt or RN, medication administered, pt repositioned in bed, safety sitter remains at bedside

## 2016-04-11 NOTE — Progress Notes (Signed)
[facility] [id number] [facility]  [facility] to provide verbal referral information.  Faxed referral for review, including ED notes indicating pt's behavior. Onalee HuaDavid states charge RN will review and call back if addt'l info is needed or when pt added to waiting list.  Ilean SkillMeghan Vanderbilt Ranieri, MSW, LCSW Clinical Social Work, Disposition  04/11/2016 [phone]
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
## 2016-04-11 NOTE — ED Notes (Signed)
Nicholas Kennedy, pts daughter aware pt rcvd Geodon medication & is currently requiring restraints, pts daughter requests to be contacted with updates

## 2016-04-12 LAB — COMPREHENSIVE METABOLIC PANEL WITH GFR
ALT: 43 U/L (ref 17–63)
AST: 38 U/L (ref 15–41)
Albumin: 3.5 g/dL (ref 3.5–5.0)
Alkaline Phosphatase: 74 U/L (ref 38–126)
Anion gap: 6 (ref 5–15)
BUN: 20 mg/dL (ref 6–20)
CO2: 25 mmol/L (ref 22–32)
Calcium: 8.7 mg/dL — ABNORMAL LOW (ref 8.9–10.3)
Chloride: 110 mmol/L (ref 101–111)
Creatinine, Ser: 1.06 mg/dL (ref 0.61–1.24)
GFR calc Af Amer: >60 mL/min
GFR calc non Af Amer: >60 mL/min
Glucose, Bld: 102 mg/dL — ABNORMAL HIGH (ref 65–99)
Potassium: 3.8 mmol/L (ref 3.5–5.1)
Sodium: 141 mmol/L (ref 135–145)
Total Bilirubin: 0.7 mg/dL (ref 0.3–1.2)
Total Protein: 6 g/dL — ABNORMAL LOW (ref 6.5–8.1)

## 2016-04-12 LAB — URINALYSIS, ROUTINE W REFLEX MICROSCOPIC
Bilirubin Urine: NEGATIVE
Glucose, UA: NEGATIVE mg/dL
Hgb urine dipstick: NEGATIVE
Ketones, ur: NEGATIVE mg/dL
Leukocytes, UA: NEGATIVE
Nitrite: NEGATIVE
Protein, ur: NEGATIVE mg/dL
Specific Gravity, Urine: 1.016 (ref 1.005–1.030)
pH: 6.5 (ref 5.0–8.0)

## 2016-04-12 LAB — RAPID URINE DRUG SCREEN, HOSP PERFORMED
AMPHETAMINES: NOT DETECTED
BARBITURATES: NOT DETECTED
BENZODIAZEPINES: POSITIVE — AB
COCAINE: NOT DETECTED
Opiates: NOT DETECTED
Tetrahydrocannabinol: NOT DETECTED

## 2016-04-12 NOTE — Progress Notes (Addendum)
[facility] [place] [facility]. Requesting addt'l labs- Chemistry profile, urinalysis, and urine drug screen.  Informed [facility] of [facility] request. [facility].   Ilean SkillMeghan Melody Cirrincione, MSW, LCSW Clinical Social Work, Disposition  04/12/2016 [phone]  L[facility] resulted[facility] at [phone]

## 2016-04-12 NOTE — ED Notes (Signed)
Pt attempting to get out of the beds still
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
## 2016-04-12 NOTE — ED Notes (Signed)
Meal here

## 2016-04-12 NOTE — ED Notes (Signed)
The pt is arguing with the sitter at present
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
## 2016-04-12 NOTE — ED Notes (Signed)
Pt more agitated tryiung to get out of the bed  Med given

## 2016-04-12 NOTE — ED Notes (Signed)
The pt appears to be more calm  He is actually following commands somewhat

## 2016-04-12 NOTE — ED Notes (Addendum)
edp on way in to extend violent restraints.  She is tied up with another  Pt

## 2016-04-12 NOTE — ED Notes (Signed)
Breakfast tray ordered at this time.

## 2016-04-12 NOTE — ED Notes (Signed)
The pt is intermittently agitated but calmer.  He appears sleepy but is constantly talking to the sitter

## 2016-04-12 NOTE — ED Notes (Signed)
Pt still atempting to get off the bed but not as frequent

## 2016-04-12 NOTE — ED Notes (Signed)
Verbal  Order given by dr Donnald Garrepfeiffer to continue theviolent restraint protocal until she can get in for a face-to face with the pt.

## 2016-04-12 NOTE — ED Provider Notes (Addendum)
Patient has been reassessed by myself at 18:34 for continuation of soft restraints. The patient has been violent swinging and kicking at staff. His sitter at bedside reports that at times he is laughing and interactive and at other times violent. She reports he has eaten his dinner without difficulty. Patient has been repositioned and is repositioning himself periodically.  Patient is alert. heart is regular. Lungs are clear. He is moving all 4 extremities purposely. Good circulation 4 extremities in soft restraints with adequate laxity for movement. Patient speaks to me although conversation is not situationally oriented. As I reached out to perform his cardiac exam he made a biting motion for my hand and then giggle for a while. Skin is warm and dry.  Patient is awaiting definitive placement.  Arby BarretteMarcy Novaleigh Kohlman, MD 04/12/16 1907    23:55 Patient is reassessed for ongoing soft restraints every 4 hours. He remains awake and alert. Vital signs remain stable. I have examined wrists and ankles, no trauma present. Examined his back and buttocks, no areas of pressure breakdown. Patient is frequently repositioning moving all extremities well in soft restraints.  Arby BarretteMarcy Kharon Hixon, MD 04/13/16 [phone]

## 2016-04-12 NOTE — ED Notes (Signed)
The pt keeps trying to get out of bed arguing with the sitter

## 2016-04-12 NOTE — ED Notes (Signed)
geodon given im

## 2016-04-12 NOTE — ED Notes (Signed)
Diaper changed pt attempted to kick the sitter but the restraints kept him  From doinfg so  Vital taken  Ativan given has not kicked in.

## 2016-04-12 NOTE — ED Notes (Signed)
Pt still trying to get out of the bed  Attempting to kick  With restraints in place

## 2016-04-12 NOTE — ED Notes (Signed)
Pt ate very little intermittent attempts to get out of bed

## 2016-04-12 NOTE — ED Provider Notes (Signed)
MSE was initiated and I personally evaluated the patient and placed orders (if any) at  1:52 PM on Apr 12, 2016.  The patient appears stable so that the remainder of the MSE may be completed by another provider.  Patient with restraints for behavior. Reportedly attempted to swinging at staff earlier in the shift when he was up to go to the bathroom. Still requiring restraints at this time. Placement pending.  Benjiman CoreNathan Rehana Uncapher, MD 04/12/16 [phone]

## 2016-04-12 NOTE — ED Notes (Signed)
Abrasion noted to L hand, per sitter, pt was hitting bed rail with his hand.  No active bleeding noted, area cleaned with saline, antibiotic ointment applied and covered with bandaid.  Pt tolerated well.

## 2016-04-12 NOTE — ED Notes (Signed)
When attempting to take restraints off 1 arm, patient began grabbing at nurses arm

## 2016-04-12 NOTE — ED Notes (Signed)
The ativan has not  Calmed him down

## 2016-04-12 NOTE — ED Notes (Signed)
The pt appears to be calmer at moment but then he attempts to get up out of the bed.  He cannot hes restraned

## 2016-04-12 NOTE — Progress Notes (Signed)
[facility] is being reviewed by medical team. Not yet on waiting list, they will contact us of further labs/documentation are needed in the meantime.

## 2016-04-12 NOTE — ED Notes (Signed)
The pt is calmer but still gets up to attempt to get off the stretcher.  Sitter at the bedside

## 2016-04-13 MED ORDER — HALOPERIDOL LACTATE 5 MG/ML IJ SOLN: 2.0000 mg | Freq: Once | INTRAMUSCULAR | Status: DC

## 2016-04-13 MED ORDER — HALOPERIDOL LACTATE 5 MG/ML IJ SOLN
2.0000 mg | Freq: Once | INTRAMUSCULAR | Status: AC
  Administered 2016-04-13: 2 mg via INTRAMUSCULAR
  Filled 2016-04-13: qty 1

## 2016-04-13 NOTE — ED Notes (Signed)
Ordered lunch tray 

## 2016-04-13 NOTE — ED Notes (Signed)
Pt placed in four point restraints at this time. Pt comfortable. Pt not fighting. Pt placed on the monitor. Pt resting

## 2016-04-13 NOTE — ED Notes (Signed)
Patient attempting to kick and Engineer, petroleumhit sitter. Pt yelling at sitter to get out of the room. Pt trying to crawl out of bed. Distraction not successful. MD made aware.

## 2016-04-13 NOTE — ED Notes (Signed)
Pt fed meal by sitter.

## 2016-04-13 NOTE — ED Notes (Signed)
Sitter made aware that pt needs MD approval to be placed in restraints. Pt does not have that approval at this time and restraints were officially discontinued during the night. Pt would need new order to have restraints placed. Pt being continually monitored. Pt comfortable and resting. No obvious combative behavior. Pt is agitated in bed, pt to be given Ativan. Pt no longer in restraints. Pt comfortable. Lying in bed. Sitter educated on the process.

## 2016-04-13 NOTE — ED Provider Notes (Signed)
Patient remains combative, in spite of Geodon, Ativan. Haldol provided. Again discussed his case with social work, Education administratornursing staff for disposition. He is awaiting psychiatric facility placement.  Gerhard Munchobert Aidyn Sportsman, MD 04/13/16 [phone]

## 2016-04-13 NOTE — ED Notes (Signed)
Pt became more agitated. Pt attempting to crawl out of bed. Kicking and attempting to Toys[employer]Hit sitter. Pt will not be redirected. Pt calmly asked to get back in bed with no success. Pt encourage to rest, but unable to do so.

## 2016-04-13 NOTE — ED Notes (Signed)
Patient stood up out of bed. Pt was yelling at sitter to "get the h* out of here." Sitter continued to talk the patient down. Pt tried to jump back in the bed. Pt agitated and will not reason with this RN and the sitter. Pt a risk of safety to self and others.

## 2016-04-13 NOTE — ED Notes (Signed)
Patient continuing to get agitated. Pt trying to get out of bed., Pt refuses to eat lunch. Pt encouraged to get back in bed. Pt calmly redirected from trying to stand.

## 2016-04-13 NOTE — ED Notes (Addendum)
Restraints removed at 0129. Patient calm at the moment. Speaking softly. Room darkened. No agitation noted at this time. Sitter at bedside.

## 2016-04-14 DIAGNOSIS — G308 Other Alzheimer's disease: Secondary | ICD-10-CM

## 2016-04-14 MED ORDER — SERTRALINE HCL 50 MG PO TABS
25.0000 mg | ORAL_TABLET | Freq: Every day | ORAL | Status: DC
  Administered 2016-04-15 – 2016-04-20 (×6): 25 mg via ORAL
  Filled 2016-04-14 (×6): qty 1

## 2016-04-14 MED ORDER — HALOPERIDOL LACTATE 2 MG/ML PO CONC
1.0000 mg | Freq: Two times a day (BID) | ORAL | Status: DC
  Administered 2016-04-14: 1 mg via ORAL
  Filled 2016-04-14 (×3): qty 0.5

## 2016-04-14 MED ORDER — GABAPENTIN 100 MG PO CAPS: 100.0000 mg | ORAL_CAPSULE | Freq: Three times a day (TID) | ORAL | Status: DC

## 2016-04-14 MED ORDER — DOXAZOSIN MESYLATE 4 MG PO TABS
4.0000 mg | ORAL_TABLET | Freq: Every day | ORAL | Status: DC
  Administered 2016-04-16 – 2016-04-19 (×5): 4 mg via ORAL
  Filled 2016-04-14 (×10): qty 1

## 2016-04-14 MED ORDER — HALOPERIDOL 2 MG PO TABS
2.0000 mg | ORAL_TABLET | Freq: Two times a day (BID) | ORAL | Status: DC
  Administered 2016-04-14 – 2016-04-19 (×10): 2 mg via ORAL
  Filled 2016-04-14 (×11): qty 1

## 2016-04-14 MED ORDER — GABAPENTIN 300 MG PO CAPS
300.0000 mg | ORAL_CAPSULE | Freq: Three times a day (TID) | ORAL | Status: DC
  Administered 2016-04-14 – 2016-04-20 (×17): 300 mg via ORAL
  Filled 2016-04-14 (×21): qty 1

## 2016-04-14 MED ORDER — LISINOPRIL 10 MG PO TABS
5.0000 mg | ORAL_TABLET | Freq: Every day | ORAL | Status: DC
  Administered 2016-04-15 – 2016-04-20 (×6): 5 mg via ORAL
  Filled 2016-04-14 (×7): qty 1

## 2016-04-14 MED ORDER — TRAZODONE HCL 50 MG PO TABS
50.0000 mg | ORAL_TABLET | Freq: Every day | ORAL | Status: DC
  Administered 2016-04-14 – 2016-04-19 (×6): 50 mg via ORAL
  Filled 2016-04-14 (×8): qty 1

## 2016-04-14 MED ORDER — STERILE WATER FOR INJECTION IJ SOLN
INTRAMUSCULAR | Status: AC
  Administered 2016-04-14: 1.2 mL
  Filled 2016-04-14: qty 10

## 2016-04-14 MED ORDER — CARBAMAZEPINE 200 MG PO TABS
200.0000 mg | ORAL_TABLET | Freq: Two times a day (BID) | ORAL | Status: DC
  Administered 2016-04-14 – 2016-04-20 (×12): 200 mg via ORAL
  Filled 2016-04-14 (×15): qty 1

## 2016-04-14 MED ORDER — LEVOTHYROXINE SODIUM 25 MCG PO TABS
25.0000 ug | ORAL_TABLET | Freq: Every day | ORAL | Status: DC
  Administered 2016-04-16 – 2016-04-20 (×5): 25 ug via ORAL
  Filled 2016-04-14 (×7): qty 1

## 2016-04-14 MED ORDER — DIVALPROEX SODIUM 250 MG PO DR TAB
500.0000 mg | DELAYED_RELEASE_TABLET | Freq: Two times a day (BID) | ORAL | Status: DC
  Administered 2016-04-14 – 2016-04-15 (×2): 500 mg via ORAL
  Filled 2016-04-14 (×4): qty 2

## 2016-04-14 MED ORDER — LEVOTHYROXINE SODIUM 25 MCG PO TABS
25.0000 ug | ORAL_TABLET | Freq: Every day | ORAL | Status: DC
  Filled 2016-04-14: qty 1

## 2016-04-14 MED ORDER — MEMANTINE HCL 10 MG PO TABS
5.0000 mg | ORAL_TABLET | Freq: Two times a day (BID) | ORAL | Status: DC
  Administered 2016-04-14 – 2016-04-20 (×12): 5 mg via ORAL
  Filled 2016-04-14 (×5): qty 1
  Filled 2016-04-14: qty 0.5
  Filled 2016-04-14 (×8): qty 1

## 2016-04-14 NOTE — ED Notes (Addendum)
All restraints have been removed. Pt. Is sleeping.
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
## 2016-04-14 NOTE — ED Notes (Signed)
RN reassessed pt's ability to come out of restraints however he continues to sleep for ten minutes then wakes up agitated once attempting to grab at RN as she attempted to cover him w/ the blanket.

## 2016-04-14 NOTE — ED Notes (Signed)
Pt. 's restraints removed, Pt. Is sleeping

## 2016-04-14 NOTE — ED Provider Notes (Signed)
Patient remains agitated and combative.  Face-to-face psychiatry consult that was ordered several days ago has not been done. This will be reconsulted.  Patient's home meds also have not been ordered and these were ordered today. Patient is refusing to take by mouth medications.  Dr. Shela CommonsJ of psychiatry has seen patient and given medication recommendations.  Nicholas OctaveStephen Lavaeh Bau, MD 04/14/16 [phone]
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
## 2016-04-14 NOTE — ED Notes (Signed)
Pt 's daughter at the bedside.

## 2016-04-14 NOTE — ED Notes (Signed)
A regular diet ordered for dinner.
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
## 2016-04-14 NOTE — ED Notes (Signed)
Lt,. Lower ankle restrained removed, pt. Is asleep

## 2016-04-14 NOTE — ED Notes (Signed)
Spoke to provider Dr. Judd Lienelo regarding the need to renew the restraint order.

## 2016-04-14 NOTE — Progress Notes (Signed)
[facility] Brett CanalesSteve. "We will call as soon as we have a bed."  Ilean SkillMeghan Jeneane Pieczynski, MSW, LCSW Clinical Social Work, Disposition  04/14/2016 [phone]

## 2016-04-14 NOTE — ED Notes (Signed)
Removed the rt. Wrist restraint., Pt. Is sleeping.

## 2016-04-14 NOTE — ED Provider Notes (Signed)
10:10 PM pt seen and evaluated, pt remains combative and agitated, trying to get out of bed- he has been tried off restraints but remains a danger to himself and others at this time.  He had been spitting out his meds earlier, but nurse states she just was able to give him po haldol- will restart restraints and reassess the effectiveness of haldol- if no improvement will give IM haldol.    Jerelyn ScottMartha Linker, MD 04/14/16 2211

## 2016-04-15 MED ORDER — DIPHENHYDRAMINE HCL 25 MG PO CAPS
50.0000 mg | ORAL_CAPSULE | Freq: Every evening | ORAL | Status: DC | PRN
Start: 2016-04-15 — End: 2016-04-20
  Administered 2016-04-15 – 2016-04-20 (×2): 50 mg via ORAL
  Filled 2016-04-15 (×4): qty 2

## 2016-04-15 NOTE — ED Notes (Signed)
Pt's spouse arrived to visit w/pt. Pt resting quietly on bed w/eyes closed. Respirations even, unlabored. Spouse did not attempt to waken pt. Aware and is in agreement w/tx plan.

## 2016-04-15 NOTE — ED Notes (Signed)
Myself, Becky, RN and sitter changed patient's bed linens, gown and diaper; gave patient a bed bath and applied a clean dry diaper with warm blankets; patient now resting with no needs at this time

## 2016-04-15 NOTE — ED Notes (Signed)
Meds that can be crushed were crushed and placed in chocolate pudding w/other meds. Pt ate only a few bites - refused the rest at this time - states will eat the rest later.

## 2016-04-15 NOTE — ED Notes (Signed)
Patient asleep at this time; sitter at bedside; lunch was order for patient; a cup of ice water placed on bedside table

## 2016-04-15 NOTE — ED Notes (Signed)
[facility] papers [facility], copy sent to medical records and original Affidavit & 1st exam placed in folder for Magistrate.

## 2016-04-15 NOTE — ED Notes (Signed)
Patient is a fan of chocolate pudding. Benadryl capsules dissolved into pudding, pt tolerated well.

## 2016-04-15 NOTE — ED Notes (Signed)
Sitter has returned from break; patient still resting; no needs at this time

## 2016-04-15 NOTE — ED Notes (Signed)
Relieving sitter for a break; patient asleep on bed at this time

## 2016-04-15 NOTE — ED Notes (Signed)
Pt ambulating in room - encouraged to sit on bed so staff could cleanse him from urinary incont. Pt compliant after much encouragement from sitter and RN. Pt took med mixed w/jello.

## 2016-04-15 NOTE — ED Notes (Signed)
Will obtain VSs and assist pt w/eating when awakens.

## 2016-04-15 NOTE — ED Notes (Signed)
Restraints removed. Pt resting peacefully. Pt snoring audible from nurses station.

## 2016-04-15 NOTE — ED Notes (Signed)
Pt ate rest of chocolate pudding.

## 2016-04-16 MED ORDER — ZIPRASIDONE MESYLATE 20 MG IM SOLR
10.0000 mg | Freq: Once | INTRAMUSCULAR | Status: AC
  Administered 2016-04-16: 10 mg via INTRAMUSCULAR
  Filled 2016-04-16: qty 20

## 2016-04-16 MED ORDER — DIVALPROEX SODIUM 125 MG PO CSDR
500.0000 mg | DELAYED_RELEASE_CAPSULE | Freq: Two times a day (BID) | ORAL | Status: DC
  Administered 2016-04-16 – 2016-04-20 (×7): 500 mg via ORAL
  Filled 2016-04-16 (×11): qty 4
  Filled 2016-04-16: qty 2
  Filled 2016-04-16: qty 4

## 2016-04-16 MED ORDER — STERILE WATER FOR INJECTION IJ SOLN
INTRAMUSCULAR | Status: AC
  Administered 2016-04-16: 1.2 mL
  Filled 2016-04-16: qty 10

## 2016-04-16 NOTE — ED Notes (Signed)
Holding patient medications due to patient being asleep.

## 2016-04-16 NOTE — ED Notes (Signed)
Changed sitters. Pt now noted to be much calmer. Dr Ethelda ChickJacubowitz aware 2nd dose of Geodon not needed at this time.

## 2016-04-16 NOTE — ED Notes (Signed)
Pt initially refused to go back into room. Followed instruction after much encouragement. Pt then refusing to stay in room.

## 2016-04-16 NOTE — ED Notes (Signed)
Took meds mixed w/oatmeal.

## 2016-04-16 NOTE — ED Notes (Signed)
Successfully administered most medications by crushing and mixing in jello. Pt kept picking out the depakote tablets.

## 2016-04-16 NOTE — ED Notes (Signed)
Per pharmacist, may change Depakote DR 500mg  BID to Depakote Sprinkles 500mg  BID d/t pt's meds must be crushed - verbal order - Dr Ranae PalmsYelverton.

## 2016-04-16 NOTE — ED Notes (Signed)
Sitter escorted pt via w/c around nurses' desk.

## 2016-04-16 NOTE — ED Notes (Signed)
Pt noted to be calmer, however, continues to attempt to be restless.

## 2016-04-16 NOTE — ED Notes (Signed)
Pt resting on bed w/eyes closed. Respirations even, unlabored. Pt wakens intermittently, talks and laughs w/sitter then returns to sleeping.

## 2016-04-16 NOTE — ED Notes (Signed)
Pt tolerated injection well - sat in chair at bedside. Prior to injection being given, pt had gotten up from bed and attempting to walk out of room. Pt experiencing difficulty in following directions.

## 2016-04-16 NOTE — ED Notes (Signed)
Pt refusing to stay in room. Sitter escorting pt around [employer] via w/c.

## 2016-04-16 NOTE — ED Notes (Signed)
Dr Jacubowitz in w/pt. 

## 2016-04-16 NOTE — ED Notes (Signed)
Sitter escorting pt around [employer] via w/c.

## 2016-04-16 NOTE — ED Notes (Signed)
Dr Ethelda ChickJacubowitz aware pt continues to be restless and continuing to attempt to get out of bed. Pt also exposing himself. Order received for Geodon 10mg  IM now.
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
## 2016-04-16 NOTE — ED Provider Notes (Signed)
Patient reportedly is trying to leave his room and agitated.. Alert, appears angry. Geodon ordered  Doug SouSam Clarissa Laird, MD 04/16/16 1606

## 2016-04-16 NOTE — ED Notes (Signed)
Pt has returned to room - lying on bed talking w/sitter.

## 2016-04-16 NOTE — ED Notes (Signed)
Will administer Synthroid when awakens.

## 2016-04-16 NOTE — ED Notes (Signed)
Pt continues to attempt to get out of bed - sitter encouraging pt to remain in bed.

## 2016-04-16 NOTE — ED Notes (Signed)
Pt took med mixed w/jello.

## 2016-04-16 NOTE — ED Notes (Signed)
Pt refusing to cooperate w/sitter and RN - attempting to leave room. Pt encouraged to lie down on bed - pt now lying down. Attempting to get up x 2 - encouraged to remain in bed. Dr Ethelda ChickJacubowitz aware.

## 2016-04-17 LAB — COMPREHENSIVE METABOLIC PANEL
ALBUMIN: 3.3 g/dL — AB (ref 3.5–5.0)
ALT: 24 U/L (ref 17–63)
AST: 17 U/L (ref 15–41)
Alkaline Phosphatase: 74 U/L (ref 38–126)
Anion gap: 10 (ref 5–15)
BUN: 24 mg/dL — AB (ref 6–20)
CHLORIDE: 105 mmol/L (ref 101–111)
CO2: 23 mmol/L (ref 22–32)
CREATININE: 0.99 mg/dL (ref 0.61–1.24)
Calcium: 8.6 mg/dL — ABNORMAL LOW (ref 8.9–10.3)
GFR calc Af Amer: >60 mL/min (ref >60)
GLUCOSE: 104 mg/dL — AB (ref 65–99)
Potassium: 3.7 mmol/L (ref 3.5–5.1)
SODIUM: 138 mmol/L (ref 135–145)
Total Bilirubin: 0.3 mg/dL (ref 0.3–1.2)
Total Protein: 6.3 g/dL — ABNORMAL LOW (ref 6.5–8.1)

## 2016-04-17 LAB — CBC WITH DIFFERENTIAL/PLATELET
BASOS ABS: 0 10*3/uL (ref 0.0–0.1)
Basophils Relative: 1 %
EOS PCT: 6 %
Eosinophils Absolute: 0.2 10*3/uL (ref 0.0–0.7)
HCT: 38.1 % — ABNORMAL LOW (ref 39.0–52.0)
Hemoglobin: 12.6 g/dL — ABNORMAL LOW (ref 13.0–17.0)
LYMPHS ABS: 1.2 10*3/uL (ref 0.7–4.0)
LYMPHS PCT: 29 %
MCH: 29.8 pg (ref 26.0–34.0)
MCHC: 33.1 g/dL (ref 30.0–36.0)
MCV: 90.1 fL (ref 78.0–100.0)
MONOS PCT: 9 %
Monocytes Absolute: 0.4 10*3/uL (ref 0.1–1.0)
Neutro Abs: 2.3 10*3/uL (ref 1.7–7.7)
Neutrophils Relative %: 55 %
PLATELETS: 168 10*3/uL (ref 150–400)
RBC: 4.23 MIL/uL (ref 4.22–5.81)
RDW: 14.5 % (ref 11.5–15.5)
WBC: 4.1 10*3/uL (ref 4.0–10.5)

## 2016-04-17 LAB — TSH: TSH: 2.52 u[IU]/mL (ref 0.350–4.500)

## 2016-04-17 LAB — CARBAMAZEPINE LEVEL, TOTAL: Carbamazepine Lvl: 6.4 ug/mL (ref 4.0–12.0)

## 2016-04-17 LAB — VALPROIC ACID LEVEL: VALPROIC ACID LVL: 50 ug/mL (ref 50.0–100.0)

## 2016-04-17 NOTE — ED Notes (Signed)
Med given pt took  Med without problem

## 2016-04-17 NOTE — ED Notes (Signed)
Pt talking loud intermittently  Sitter within arms reach

## 2016-04-17 NOTE — ED Notes (Signed)
Patient was given a snack and drink, and a regular diet ordered for lunch. 

## 2016-04-17 NOTE — ED Notes (Signed)
Pt's daughter visiting, pt calm and interacting with her.

## 2016-04-17 NOTE — Progress Notes (Signed)
[facility] Jonny RuizJohn.

## 2016-04-17 NOTE — ED Notes (Signed)
Loud outbursts

## 2016-04-17 NOTE — ED Notes (Signed)
Pt getting loud intermittent  Still not sleepy

## 2016-04-18 NOTE — Progress Notes (Signed)
Followed up on inpatient referals. [facility] per Coralee NorthNina. As patient has been more cooperative and redirectable for past 48 hours per chart, also began referring patient to other gero psych facilities.  [facility] Lidie Glade, MSW, LCSW Clinical Social Work, Disposition  04/18/2016 [phone]

## 2016-04-18 NOTE — ED Notes (Signed)
Pt attempting to get out of bed x 3. Sitter encouraged pt to remain in bed.

## 2016-04-18 NOTE — ED Notes (Signed)
Pt took meds well - whole. Pt given jello - feeding self.

## 2016-04-18 NOTE — ED Notes (Signed)
Will obtain VS when pt awakens.  

## 2016-04-18 NOTE — ED Notes (Signed)
Pt allowing NT to shave his face.

## 2016-04-18 NOTE — ED Notes (Signed)
Pt lying on bed - has been intermittently yelling out and cursing at staff.

## 2016-04-18 NOTE — ED Notes (Signed)
Pt eating snack given. 

## 2016-04-18 NOTE — ED Notes (Signed)
Patient was given a snack and drink and a regular diet ordered for lunch. 

## 2016-04-19 MED ORDER — HYDROXYZINE HCL 25 MG PO TABS: 25.0000 mg | ORAL_TABLET | Freq: Four times a day (QID) | ORAL | Status: DC | PRN

## 2016-04-19 MED ORDER — LORAZEPAM 2 MG/ML IJ SOLN
0.5000 mg | Freq: Once | INTRAMUSCULAR | Status: AC
  Administered 2016-04-19: 0.5 mg via INTRAMUSCULAR

## 2016-04-19 MED ORDER — QUETIAPINE FUMARATE 25 MG PO TABS
25.0000 mg | ORAL_TABLET | Freq: Three times a day (TID) | ORAL | Status: DC
  Administered 2016-04-19 – 2016-04-20 (×4): 25 mg via ORAL
  Filled 2016-04-19 (×4): qty 1
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
## 2016-04-19 NOTE — ED Notes (Signed)
Patient was given a snack, drink, and a regular diet ordered for lunch.

## 2016-04-19 NOTE — Progress Notes (Signed)
Followed up on inpatient referrals.  [facility] Brett CanalesSteve.  [facility]- pt's referral not yet reviewed per Alyssa- advises dx Alzheimer's not necessarily exclusionary, but referral will be closely reviewed as primary symptoms cannot be dementia-related for their program [facility]- Debbie advised referral did not get reviewed yesterday as geri unit filled up. Advised re-fax  Declined: [facility]- per Denny PeonErin, due to aggression [facility]- per Baldo Asharl, due to acuity  Left voicemail inquiring about bed availability: [facility] [place]  [facility] at capacity per [place].

## 2016-04-19 NOTE — ED Notes (Signed)
Pt is sleeping at present.

## 2016-04-19 NOTE — ED Notes (Signed)
Patient lunch at bedside.  

## 2016-04-19 NOTE — ED Notes (Signed)
Pt awake and eating lunch tray. Pt ate 50% of meal.

## 2016-04-19 NOTE — ED Notes (Addendum)
Pt getting agitated, swearing at sitter-- attempting to get out of bed. Swinging at staff-- states "I just want to go over there!" Dr. Ranae PalmsYelverton and Renata Capriceonrad, NP informed of pt's behavior. Orders received.

## 2016-04-20 DIAGNOSIS — I10 Essential (primary) hypertension: Secondary | ICD-10-CM | POA: Diagnosis not present

## 2016-04-20 DIAGNOSIS — R601 Generalized edema: Secondary | ICD-10-CM | POA: Diagnosis not present

## 2016-04-20 DIAGNOSIS — R351 Nocturia: Secondary | ICD-10-CM | POA: Diagnosis not present

## 2016-04-20 DIAGNOSIS — Z9181 History of falling: Secondary | ICD-10-CM | POA: Diagnosis not present

## 2016-04-20 DIAGNOSIS — R3915 Urgency of urination: Secondary | ICD-10-CM | POA: Diagnosis not present

## 2016-04-20 DIAGNOSIS — F0280 Dementia in other diseases classified elsewhere without behavioral disturbance: Secondary | ICD-10-CM | POA: Diagnosis not present

## 2016-04-20 DIAGNOSIS — E860 Dehydration: Secondary | ICD-10-CM | POA: Diagnosis not present

## 2016-04-20 DIAGNOSIS — G309 Alzheimer's disease, unspecified: Secondary | ICD-10-CM | POA: Diagnosis not present

## 2016-04-20 DIAGNOSIS — R51 Headache: Secondary | ICD-10-CM | POA: Diagnosis not present

## 2016-04-20 DIAGNOSIS — N401 Enlarged prostate with lower urinary tract symptoms: Secondary | ICD-10-CM | POA: Diagnosis not present

## 2016-04-20 DIAGNOSIS — F209 Schizophrenia, unspecified: Secondary | ICD-10-CM | POA: Diagnosis not present

## 2016-04-20 DIAGNOSIS — R5383 Other fatigue: Secondary | ICD-10-CM | POA: Diagnosis not present

## 2016-04-20 DIAGNOSIS — Z79899 Other long term (current) drug therapy: Secondary | ICD-10-CM | POA: Diagnosis not present

## 2016-04-20 DIAGNOSIS — N400 Enlarged prostate without lower urinary tract symptoms: Secondary | ICD-10-CM | POA: Diagnosis not present

## 2016-04-20 DIAGNOSIS — F323 Major depressive disorder, single episode, severe with psychotic features: Secondary | ICD-10-CM | POA: Diagnosis not present

## 2016-04-20 DIAGNOSIS — F319 Bipolar disorder, unspecified: Secondary | ICD-10-CM | POA: Diagnosis not present

## 2016-04-20 DIAGNOSIS — J45909 Unspecified asthma, uncomplicated: Secondary | ICD-10-CM | POA: Diagnosis not present

## 2016-04-20 DIAGNOSIS — J4530 Mild persistent asthma, uncomplicated: Secondary | ICD-10-CM | POA: Diagnosis not present

## 2016-04-20 DIAGNOSIS — E782 Mixed hyperlipidemia: Secondary | ICD-10-CM | POA: Diagnosis not present

## 2016-04-20 DIAGNOSIS — I9589 Other hypotension: Secondary | ICD-10-CM | POA: Diagnosis not present

## 2016-04-20 DIAGNOSIS — F919 Conduct disorder, unspecified: Secondary | ICD-10-CM | POA: Diagnosis not present

## 2016-04-20 DIAGNOSIS — S0091XA Abrasion of unspecified part of head, initial encounter: Secondary | ICD-10-CM | POA: Diagnosis not present

## 2016-04-20 DIAGNOSIS — K5781 Diverticulitis of intestine, part unspecified, with perforation and abscess with bleeding: Secondary | ICD-10-CM | POA: Diagnosis not present

## 2016-04-20 NOTE — ED Notes (Addendum)
Patient was given a snack and coffee. °

## 2016-04-20 NOTE — ED Notes (Signed)
Pt cleaned of small amount of stool. Fresh brief and linen applied. Pt eats with assistance.

## 2016-04-20 NOTE — ED Notes (Signed)
Message left on Sgt Paschal VM to determine when transport would be available.

## 2016-04-20 NOTE — ED Notes (Signed)
Pt has removed brief and has torn away top. Currently refuses to put clothing or brief back on. Pt covered for modesty.

## 2016-04-20 NOTE — ED Notes (Signed)
Diet order taken for lunch. 

## 2016-04-20 NOTE — Progress Notes (Signed)
Followed up on inpatient referrals. Pt re-evaluated yesterday 5/10 and psychiatry continues to recommend inpt geriatric psych treatment.  Pt remains on waiting list for [facility] per Filbert BertholdSonya  [facility]- per French Anaracy, geriatric intake staff not yet available. Unsure of status of referral. Will have intake (Cedric) Regulatory affairs officercall writer back. [place]- left voicemail w/ [place] intake and [facility]- per Agnewindy, possibility of bed late today- fax referral [facility] left voicemail [facility]- Jonny RuizJohn advises he cannot locate previously sent referral therefore does not know if it was reviewed- re-refer  Declined: [facility]- per Denny PeonErin due to aggression [facility]- per Baldo Asharl due to acuity

## 2016-04-20 NOTE — Progress Notes (Signed)
[facility] [place] [facility], [address] hall. Facility address: [address], [place], [place] [address] Accepting MD: Dr. Garey HamVanhorn Report # for RN: [phone]  Informed [facility].

## 2016-04-20 NOTE — ED Notes (Signed)
[facility]. Asked if pt still needed placement, RN advised that he does. No other information was asked for or given by caller.

## 2016-04-20 NOTE — ED Notes (Signed)
Pt OOB in hallway. Ambulates with minimal assist.

## 2016-04-20 NOTE — ED Notes (Signed)
Sgt Paschal contacted for transport. States he will call when he has available unit.

## 2016-05-30 ENCOUNTER — Encounter: Payer: PPO | Admitting: Internal Medicine

## 2016-07-25 DIAGNOSIS — F329 Major depressive disorder, single episode, unspecified: Secondary | ICD-10-CM | POA: Diagnosis not present

## 2016-07-25 DIAGNOSIS — F918 Other conduct disorders: Secondary | ICD-10-CM | POA: Diagnosis not present

## 2016-07-25 DIAGNOSIS — I10 Essential (primary) hypertension: Secondary | ICD-10-CM | POA: Diagnosis not present

## 2016-07-25 DIAGNOSIS — G309 Alzheimer's disease, unspecified: Secondary | ICD-10-CM | POA: Diagnosis not present

## 2016-07-25 DIAGNOSIS — F0281 Dementia in other diseases classified elsewhere with behavioral disturbance: Secondary | ICD-10-CM | POA: Diagnosis not present

## 2016-07-25 DIAGNOSIS — F419 Anxiety disorder, unspecified: Secondary | ICD-10-CM | POA: Diagnosis not present

## 2016-07-27 ENCOUNTER — Emergency Department (HOSPITAL_COMMUNITY)
Admission: EM | Admit: 2016-07-27 | Discharge: 2016-07-28 | Disposition: A | Discharge status: Home / Self Care | Payer: PPO | Attending: Emergency Medicine | Admitting: Emergency Medicine

## 2016-07-27 ENCOUNTER — Encounter (HOSPITAL_COMMUNITY): Payer: Self-pay

## 2016-07-27 DIAGNOSIS — Y939 Activity, unspecified: Secondary | ICD-10-CM | POA: Insufficient documentation

## 2016-07-27 DIAGNOSIS — Z79899 Other long term (current) drug therapy: Secondary | ICD-10-CM | POA: Insufficient documentation

## 2016-07-27 DIAGNOSIS — Y92009 Unspecified place in unspecified non-institutional (private) residence as the place of occurrence of the external cause: Secondary | ICD-10-CM | POA: Insufficient documentation

## 2016-07-27 DIAGNOSIS — Y999 Unspecified external cause status: Secondary | ICD-10-CM | POA: Insufficient documentation

## 2016-07-27 DIAGNOSIS — J45909 Unspecified asthma, uncomplicated: Secondary | ICD-10-CM | POA: Diagnosis not present

## 2016-07-27 DIAGNOSIS — S199XXA Unspecified injury of neck, initial encounter: Secondary | ICD-10-CM | POA: Diagnosis not present

## 2016-07-27 DIAGNOSIS — Z87891 Personal history of nicotine dependence: Secondary | ICD-10-CM | POA: Insufficient documentation

## 2016-07-27 DIAGNOSIS — W1839XA Other fall on same level, initial encounter: Secondary | ICD-10-CM | POA: Insufficient documentation

## 2016-07-27 DIAGNOSIS — G309 Alzheimer's disease, unspecified: Secondary | ICD-10-CM | POA: Insufficient documentation

## 2016-07-27 DIAGNOSIS — W19XXXA Unspecified fall, initial encounter: Secondary | ICD-10-CM

## 2016-07-27 DIAGNOSIS — S0083XA Contusion of other part of head, initial encounter: Secondary | ICD-10-CM | POA: Diagnosis not present

## 2016-07-27 DIAGNOSIS — S0990XA Unspecified injury of head, initial encounter: Secondary | ICD-10-CM | POA: Diagnosis not present

## 2016-07-27 DIAGNOSIS — G4489 Other headache syndrome: Secondary | ICD-10-CM | POA: Diagnosis not present

## 2016-07-27 NOTE — ED Provider Notes (Signed)
[facility]Y DEPT Provider Note   CSN: [id number] Arrival date & time: 07/27/16  2303  By signing my name below, I, Jasmyn B. Alexander, attest that this documentation has been prepared under the direction and in the presence of Dione Booze, MD. Electronically Signed: Gillis Ends. Lyn Hollingshead, ED Scribe. 07/27/16. 12:02 AM.  History   Chief Complaint Chief Complaint  Patient presents with  . Fall   LEVEL V CAVEAT - DEMENTIA  HPI HPI Comments: Nicholas Kennedy is a 75 y.o. male brought in by ambulance, with PMHx of Alzheimer's Dementia and HLD who presents to the Emergency Department complaining of a sudden onset, head injury s/p ground-level fall which occurred PTA. Per EMS, pt was transported from [facility] assisted living. EMS notes that patient was bending over when he lost his balance and fell. He hit his head on the door frame but denies any LOC. He is not on any anticoagulants. Pt is unable to answer any orientation questions during examination.  The history is provided by the patient and the EMS personnel. No language interpreter was used.   Past Medical History:  Diagnosis Date  . Allergy   . Alzheimer's dementia 2014  . Asthma   . BPH (benign prostatic hypertrophy)   . Diverticulitis   . ED (erectile dysfunction)   . History of nephrolithiasis   . Hyperlipidemia   . Peyronie's disease     Patient Active Problem List   Diagnosis Date Noted  . Episodic mood disorder (HCC) 11/09/2014  . Tremor 07/08/2014  . Alzheimer's dementia with behavioral disturbance   . Routine general medical examination at a health care facility 10/11/2012  . Vitamin B12 deficiency 02/20/2011  . HYPERLIPIDEMIA 02/13/2008  . ALLERGIC RHINITIS 02/13/2008  . DIVERTICULOSIS, COLON 02/13/2008  . BPH (benign prostatic hypertrophy) 02/13/2008  . 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 ACTINIC KERATOSIS 02/13/2008    Past Surgical History:  Procedure Laterality Date  . TONSILLECTOMY       Home Medications    Prior to Admission medications   Medication Sig Start Date End Date Taking? Authorizing Provider  acetaminophen (TYLENOL) 325 MG tablet Take 650 mg by mouth every 8 (eight) hours as needed for mild pain.    Historical Provider, MD  carbamazepine (TEGRETOL) 200 MG tablet Take 200 mg by mouth 2 (two) times daily.    Historical Provider, MD  divalproex (DEPAKOTE) 500 MG DR tablet Take 1 tablet (500 mg total) by mouth 2 (two) times daily. Patient not taking: Reported on 04/09/2016 03/03/16   Sharman Cheek, MD  doxazosin (CARDURA) 4 MG tablet Take 1 tablet (4 mg total) by mouth at bedtime. 06/01/15   Karie Schwalbe, MD  Doxepin HCl 3 MG TABS Take 3 mg by mouth at bedtime as needed (insomnia).    Historical Provider, MD  haloperidol (HALDOL) 0.5 MG tablet Take 1 tablet (0.5 mg total) by mouth 2 (two) times daily. Patient not taking: Reported on 04/09/2016 03/03/16   Sharman Cheek, MD  haloperidol (HALDOL) 2 MG/ML solution Take 1 mg by mouth 2 (two) times daily.    Historical Provider, MD  levothyroxine (SYNTHROID, LEVOTHROID) 25 MCG tablet Take 25 mcg by mouth at bedtime.    Historical Provider, MD  lisinopril (PRINIVIL,ZESTRIL) 5 MG tablet Take 1 tablet (5 mg total) by mouth daily. 03/03/16   Sharman Cheek, MD  Melatonin 1 MG TABS Take 1 tablet (1 mg total) by mouth at bedtime and may repeat dose one time if needed. Patient taking differently:  Take 3 mg by mouth at bedtime.  03/03/16   Sharman CheekPhillip Stafford, MD  memantine (NAMENDA) 5 MG tablet Take 1 tablet (5 mg total) by mouth 2 (two) times daily. 03/03/16   Sharman CheekPhillip Stafford, MD  OLANZapine (ZYPREXA) 10 MG tablet Take 1 tablet (10 mg total) by mouth at bedtime. Patient not taking: Reported on 04/09/2016 03/03/16   Sharman CheekPhillip Stafford, MD  polyethylene glycol [facility](MIRALAX / Ethelene HalGLYCOLAX) packet Take 17 g by mouth 2 (two) times daily.    Historical Provider, MD  PRESCRIPTION MEDICATION Apply 1 application topically daily as needed  (agitation/anxiety). "Lorazepam 0.5mg  gel"    Historical Provider, MD  sertraline (ZOLOFT) 50 MG tablet TAKE 1 TABLET (50 MG TOTAL) BY MOUTH DAILY. Patient taking differently: Take 25 mg by mouth daily. TAKE 1 TABLET (50 MG TOTAL) BY MOUTH DAILY. 03/03/16   Sharman CheekPhillip Stafford, MD  traZODone (DESYREL) 100 MG tablet Take 1 tablet (100 mg total) by mouth at bedtime. Patient taking differently: Take 50 mg by mouth at bedtime.  03/03/16   Sharman CheekPhillip Stafford, MD    Family History Family History  Problem Relation Age of Onset  . Alzheimer's disease Mother   . Alcohol abuse Father   . Cancer Father     prostate cancer  . Coronary artery disease Neg Hx   . Hypertension Neg Hx   . Diabetes Neg Hx     Social History Social History  Substance Use Topics  . Smoking status: Former Games developermoker  . Smokeless tobacco: Never Used  . Alcohol use No    Allergies   Penicillins   Review of Systems Review of Systems  Unable to perform ROS: Dementia   Physical Exam Updated Vital Signs BP 112/62 (BP Location: Left Arm)   Pulse 65   Temp 98.3 F (36.8 C) (Oral)   SpO2 100%   Physical Exam  Constitutional: He appears well-developed and well-nourished.  HENT:  Head: Normocephalic.  3cm hematoma on the right side of forehead.   Eyes: EOM are normal. Pupils are equal, round, and reactive to light.  Neck: No JVD present.  Nontender   Cardiovascular: Normal rate, regular rhythm and normal heart sounds.   No murmur heard. Pulmonary/Chest: Effort normal and breath sounds normal. He has no wheezes. He has no rales. He exhibits no tenderness.  Abdominal: Soft. Bowel sounds are normal. He exhibits no distension and no mass. There is no tenderness.  Musculoskeletal: Normal range of motion. He exhibits no edema.  Lymphadenopathy:    He has no cervical adenopathy.  Neurological: He is alert. No cranial nerve deficit. He exhibits normal muscle tone. Coordination normal.  Oriented to person, but not place or  time  Skin: Skin is warm and dry. No rash noted.  Psychiatric: He has a normal mood and affect. His behavior is normal.  Nursing note and vitals reviewed.  ED Treatments / Results  DIAGNOSTIC STUDIES: Oxygen Saturation is 100% on RA, normal by my interpretation.    COORDINATION OF CARE: 11:51 PM-Discussed treatment plan which includes CT Head and C-Spine with pt at bedside and pt agreed to plan.    Radiology Ct Head Wo Contrast  Result Date: 07/28/2016 CLINICAL DATA:  Patient lost balance and fell, striking head on door frame. Struck right forehead and temporal region. No loss of consciousness. EXAM: CT HEAD WITHOUT CONTRAST CT CERVICAL SPINE WITHOUT CONTRAST TECHNIQUE: Multidetector CT imaging of the head and cervical spine was performed following the standard protocol without intravenous contrast. Multiplanar CT image reconstructions of  the cervical spine were also generated. COMPARISON:  None. FINDINGS: CT HEAD FINDINGS Mild cerebral atrophy. Ventricular dilatation consistent with central atrophy. Patchy low-attenuation changes in the periventricular white matter consistent small vessel ischemia. No mass effect or midline shift. No abnormal extra-axial fluid collections. Gray-white matter junctions are distinct. Basal cisterns are not effaced. No evidence of acute intracranial hemorrhage. No depressed skull fractures. Visualized paranasal sinuses and mastoid air cells are not opacified. Subcutaneous scalp hematoma over the left frontal region. CT CERVICAL SPINE FINDINGS Reversal of the usual cervical lordosis. This may be due to patient positioning but ligamentous injury or muscle spasm could also have this appearance and are not excluded. Degenerative changes in the cervical spine with narrowed interspaces and endplate hypertrophic changes. Slight anterior subluxation of C4 on C5. This may be degenerative but ligamentous injury is not excluded. No vertebral compression deformities. No  prevertebral soft tissue swelling. No focal bone lesion or bone destruction. Bone cortex appears intact. C1-2 articulation appears intact. Dominant left thyroid gland nodule measuring 3.3 cm diameter. Consider follow-up with elective ultrasound as clinically indicated. IMPRESSION: No acute intracranial abnormalities. Chronic atrophy and small vessel ischemic changes. Nonspecific reversal of the usual cervical lordosis. Degenerative changes throughout the cervical spine. Slight anterior subluxation of C4 on C5 is likely degenerative but ligamentous injury not entirely excluded. No acute displaced fractures demonstrated. **An incidental finding of potential clinical significance has been found. 3.3 cm diameter left thyroid gland nodule. Consider followup with elective ultrasound as clinically indicated.** Electronically Signed   By: Burman NievesWilliam  Stevens M.D.   On: 07/28/2016 01:14   Ct Cervical Spine Wo Contrast  Result Date: 07/28/2016 CLINICAL DATA:  Patient lost balance and fell, striking head on door frame. Struck right forehead and temporal region. No loss of consciousness. EXAM: CT HEAD WITHOUT CONTRAST CT CERVICAL SPINE WITHOUT CONTRAST TECHNIQUE: Multidetector CT imaging of the head and cervical spine was performed following the standard protocol without intravenous contrast. Multiplanar CT image reconstructions of the cervical spine were also generated. COMPARISON:  None. FINDINGS: CT HEAD FINDINGS Mild cerebral atrophy. Ventricular dilatation consistent with central atrophy. Patchy low-attenuation changes in the periventricular white matter consistent small vessel ischemia. No mass effect or midline shift. No abnormal extra-axial fluid collections. Gray-white matter junctions are distinct. Basal cisterns are not effaced. No evidence of acute intracranial hemorrhage. No depressed skull fractures. Visualized paranasal sinuses and mastoid air cells are not opacified. Subcutaneous scalp hematoma over the left  frontal region. CT CERVICAL SPINE FINDINGS Reversal of the usual cervical lordosis. This may be due to patient positioning but ligamentous injury or muscle spasm could also have this appearance and are not excluded. Degenerative changes in the cervical spine with narrowed interspaces and endplate hypertrophic changes. Slight anterior subluxation of C4 on C5. This may be degenerative but ligamentous injury is not excluded. No vertebral compression deformities. No prevertebral soft tissue swelling. No focal bone lesion or bone destruction. Bone cortex appears intact. C1-2 articulation appears intact. Dominant left thyroid gland nodule measuring 3.3 cm diameter. Consider follow-up with elective ultrasound as clinically indicated. IMPRESSION: No acute intracranial abnormalities. Chronic atrophy and small vessel ischemic changes. Nonspecific reversal of the usual cervical lordosis. Degenerative changes throughout the cervical spine. Slight anterior subluxation of C4 on C5 is likely degenerative but ligamentous injury not entirely excluded. No acute displaced fractures demonstrated. **An incidental finding of potential clinical significance has been found. 3.3 cm diameter left thyroid gland nodule. Consider followup with elective ultrasound as clinically indicated.** Electronically Signed  By: Burman Nieves M.D.   On: 07/28/2016 01:14    Procedures Procedures (including critical care time)  Medications Ordered in ED Medications - No data to display  Initial Impression / Assessment and Plan / ED Course  I have reviewed the triage vital signs and the nursing notes.  Pertinent imaging results that were available during my care of the patient were reviewed by me and considered in my medical decision making (see chart for details).  Clinical Course    Fall with forehead contusion. Old records are reviewed, and he has no relevant past visits. He is sent for CT of head and cervical spine which show no  evidence of acute injury. He is discharged with fall prevention instructions.  Final Clinical Impressions(s) / ED Diagnoses   Final diagnoses:  Fall at home, initial encounter  Forehead contusion, initial encounter    New Prescriptions New Prescriptions   No medications on file   I personally performed the services described in this documentation, which was scribed in my presence. The recorded information has been reviewed and is accurate.       Dione Booze, MD 07/28/16 [phone]

## 2016-07-27 NOTE — ED Triage Notes (Signed)
[facility] BIB [facility]. Pt reports that he was bending over and lost his balance. Denies dizziness or LOC. Pt reports hitting his head on a door frame. Pt presents with a gold ball-sized hematoma above his R eyebrow. Pt denies being on blood thinners. A&Ox3.

## 2016-07-27 NOTE — ED Notes (Signed)
Bed: ZO10WA25 Expected date:  Expected time:  Means of arrival:  Comments: EMS 75 yo male fall hit head/hematoma right forehead

## 2016-07-28 ENCOUNTER — Emergency Department (HOSPITAL_COMMUNITY): Payer: PPO

## 2016-07-28 DIAGNOSIS — S199XXA Unspecified injury of neck, initial encounter: Secondary | ICD-10-CM | POA: Diagnosis not present

## 2016-07-28 DIAGNOSIS — S0990XA Unspecified injury of head, initial encounter: Secondary | ICD-10-CM | POA: Diagnosis not present

## 2016-07-28 DIAGNOSIS — R259 Unspecified abnormal involuntary movements: Secondary | ICD-10-CM | POA: Diagnosis not present

## 2016-07-28 DIAGNOSIS — R2689 Other abnormalities of gait and mobility: Secondary | ICD-10-CM | POA: Diagnosis not present

## 2016-07-28 NOTE — ED Notes (Addendum)
[id number] Delice Bisonara (Daughter/Guardian)

## 2016-08-01 DIAGNOSIS — G309 Alzheimer's disease, unspecified: Secondary | ICD-10-CM | POA: Diagnosis not present

## 2016-08-01 DIAGNOSIS — F918 Other conduct disorders: Secondary | ICD-10-CM | POA: Diagnosis not present

## 2016-08-01 DIAGNOSIS — F419 Anxiety disorder, unspecified: Secondary | ICD-10-CM | POA: Diagnosis not present

## 2016-08-01 DIAGNOSIS — N401 Enlarged prostate with lower urinary tract symptoms: Secondary | ICD-10-CM | POA: Diagnosis not present

## 2016-08-01 DIAGNOSIS — F0281 Dementia in other diseases classified elsewhere with behavioral disturbance: Secondary | ICD-10-CM | POA: Diagnosis not present

## 2016-08-01 DIAGNOSIS — F329 Major depressive disorder, single episode, unspecified: Secondary | ICD-10-CM | POA: Diagnosis not present

## 2016-08-01 DIAGNOSIS — I10 Essential (primary) hypertension: Secondary | ICD-10-CM | POA: Diagnosis not present

## 2016-08-01 DIAGNOSIS — R269 Unspecified abnormalities of gait and mobility: Secondary | ICD-10-CM | POA: Diagnosis not present

## 2016-08-03 DIAGNOSIS — F321 Major depressive disorder, single episode, moderate: Secondary | ICD-10-CM | POA: Diagnosis not present

## 2016-08-03 DIAGNOSIS — G309 Alzheimer's disease, unspecified: Secondary | ICD-10-CM | POA: Diagnosis not present

## 2016-08-03 DIAGNOSIS — F411 Generalized anxiety disorder: Secondary | ICD-10-CM | POA: Diagnosis not present

## 2016-08-03 DIAGNOSIS — G4700 Insomnia, unspecified: Secondary | ICD-10-CM | POA: Diagnosis not present

## 2016-08-03 DIAGNOSIS — F0281 Dementia in other diseases classified elsewhere with behavioral disturbance: Secondary | ICD-10-CM | POA: Diagnosis not present

## 2016-08-03 DIAGNOSIS — F062 Psychotic disorder with delusions due to known physiological condition: Secondary | ICD-10-CM | POA: Diagnosis not present

## 2016-08-09 DIAGNOSIS — Z79899 Other long term (current) drug therapy: Secondary | ICD-10-CM | POA: Diagnosis not present

## 2016-09-01 DIAGNOSIS — M6281 Muscle weakness (generalized): Secondary | ICD-10-CM | POA: Diagnosis not present

## 2016-09-01 DIAGNOSIS — R296 Repeated falls: Secondary | ICD-10-CM | POA: Diagnosis not present

## 2016-09-11 DIAGNOSIS — R296 Repeated falls: Secondary | ICD-10-CM | POA: Diagnosis not present

## 2016-09-11 DIAGNOSIS — M6281 Muscle weakness (generalized): Secondary | ICD-10-CM | POA: Diagnosis not present

## 2016-09-14 DIAGNOSIS — G4700 Insomnia, unspecified: Secondary | ICD-10-CM | POA: Diagnosis not present

## 2016-09-14 DIAGNOSIS — F0281 Dementia in other diseases classified elsewhere with behavioral disturbance: Secondary | ICD-10-CM | POA: Diagnosis not present

## 2016-09-14 DIAGNOSIS — F321 Major depressive disorder, single episode, moderate: Secondary | ICD-10-CM | POA: Diagnosis not present

## 2016-09-14 DIAGNOSIS — F062 Psychotic disorder with delusions due to known physiological condition: Secondary | ICD-10-CM | POA: Diagnosis not present

## 2016-09-14 DIAGNOSIS — F411 Generalized anxiety disorder: Secondary | ICD-10-CM | POA: Diagnosis not present

## 2016-09-19 DIAGNOSIS — F0281 Dementia in other diseases classified elsewhere with behavioral disturbance: Secondary | ICD-10-CM | POA: Diagnosis not present

## 2016-09-19 DIAGNOSIS — I10 Essential (primary) hypertension: Secondary | ICD-10-CM | POA: Diagnosis not present

## 2016-09-19 DIAGNOSIS — G309 Alzheimer's disease, unspecified: Secondary | ICD-10-CM | POA: Diagnosis not present

## 2016-09-19 DIAGNOSIS — F419 Anxiety disorder, unspecified: Secondary | ICD-10-CM | POA: Diagnosis not present

## 2016-09-19 DIAGNOSIS — F329 Major depressive disorder, single episode, unspecified: Secondary | ICD-10-CM | POA: Diagnosis not present

## 2016-09-19 DIAGNOSIS — N401 Enlarged prostate with lower urinary tract symptoms: Secondary | ICD-10-CM | POA: Diagnosis not present

## 2016-09-19 DIAGNOSIS — F918 Other conduct disorders: Secondary | ICD-10-CM | POA: Diagnosis not present

## 2016-09-19 DIAGNOSIS — R269 Unspecified abnormalities of gait and mobility: Secondary | ICD-10-CM | POA: Diagnosis not present

## 2016-09-28 DIAGNOSIS — G4700 Insomnia, unspecified: Secondary | ICD-10-CM | POA: Diagnosis not present

## 2016-09-28 DIAGNOSIS — F411 Generalized anxiety disorder: Secondary | ICD-10-CM | POA: Diagnosis not present

## 2016-09-28 DIAGNOSIS — F321 Major depressive disorder, single episode, moderate: Secondary | ICD-10-CM | POA: Diagnosis not present

## 2016-09-28 DIAGNOSIS — F0281 Dementia in other diseases classified elsewhere with behavioral disturbance: Secondary | ICD-10-CM | POA: Diagnosis not present

## 2016-09-28 DIAGNOSIS — F062 Psychotic disorder with delusions due to known physiological condition: Secondary | ICD-10-CM | POA: Diagnosis not present

## 2016-10-07 ENCOUNTER — Emergency Department (HOSPITAL_COMMUNITY): Payer: PPO

## 2016-10-07 ENCOUNTER — Emergency Department (HOSPITAL_COMMUNITY)
Admission: EM | Admit: 2016-10-07 | Discharge: 2016-10-08 | Disposition: A | Discharge status: Skilled Nursing Facility | Payer: PPO | Attending: Emergency Medicine | Admitting: Emergency Medicine

## 2016-10-07 DIAGNOSIS — F02818 Dementia in other diseases classified elsewhere, unspecified severity, with other behavioral disturbance: Secondary | ICD-10-CM | POA: Diagnosis present

## 2016-10-07 DIAGNOSIS — G309 Alzheimer's disease, unspecified: Secondary | ICD-10-CM | POA: Diagnosis not present

## 2016-10-07 DIAGNOSIS — J45909 Unspecified asthma, uncomplicated: Secondary | ICD-10-CM | POA: Insufficient documentation

## 2016-10-07 DIAGNOSIS — R451 Restlessness and agitation: Secondary | ICD-10-CM | POA: Diagnosis not present

## 2016-10-07 DIAGNOSIS — F0281 Dementia in other diseases classified elsewhere with behavioral disturbance: Secondary | ICD-10-CM | POA: Diagnosis not present

## 2016-10-07 DIAGNOSIS — Z01818 Encounter for other preprocedural examination: Secondary | ICD-10-CM | POA: Diagnosis not present

## 2016-10-07 DIAGNOSIS — Z79899 Other long term (current) drug therapy: Secondary | ICD-10-CM | POA: Diagnosis not present

## 2016-10-07 DIAGNOSIS — G308 Other Alzheimer's disease: Secondary | ICD-10-CM | POA: Diagnosis not present

## 2016-10-07 DIAGNOSIS — Z87891 Personal history of nicotine dependence: Secondary | ICD-10-CM | POA: Diagnosis not present

## 2016-10-07 DIAGNOSIS — F918 Other conduct disorders: Secondary | ICD-10-CM | POA: Diagnosis not present

## 2016-10-07 DIAGNOSIS — Z8249 Family history of ischemic heart disease and other diseases of the circulatory system: Secondary | ICD-10-CM | POA: Diagnosis not present

## 2016-10-07 DIAGNOSIS — F29 Unspecified psychosis not due to a substance or known physiological condition: Secondary | ICD-10-CM | POA: Diagnosis not present

## 2016-10-07 DIAGNOSIS — Z833 Family history of diabetes mellitus: Secondary | ICD-10-CM | POA: Diagnosis not present

## 2016-10-07 LAB — RAPID URINE DRUG SCREEN, HOSP PERFORMED
AMPHETAMINES: NOT DETECTED
Barbiturates: NOT DETECTED
Benzodiazepines: NOT DETECTED
Cocaine: NOT DETECTED
OPIATES: NOT DETECTED
Tetrahydrocannabinol: NOT DETECTED

## 2016-10-07 LAB — COMPREHENSIVE METABOLIC PANEL
ALT: 13 U/L — AB (ref 17–63)
ANION GAP: 8 (ref 5–15)
AST: 16 U/L (ref 15–41)
Albumin: 4.1 g/dL (ref 3.5–5.0)
Alkaline Phosphatase: 76 U/L (ref 38–126)
BUN: 25 mg/dL — ABNORMAL HIGH (ref 6–20)
CHLORIDE: 104 mmol/L (ref 101–111)
CO2: 26 mmol/L (ref 22–32)
Calcium: 8.8 mg/dL — ABNORMAL LOW (ref 8.9–10.3)
Creatinine, Ser: 0.83 mg/dL (ref 0.61–1.24)
GFR calc non Af Amer: >60 mL/min (ref >60)
Glucose, Bld: 105 mg/dL — ABNORMAL HIGH (ref 65–99)
POTASSIUM: 3.8 mmol/L (ref 3.5–5.1)
SODIUM: 138 mmol/L (ref 135–145)
Total Bilirubin: 0.6 mg/dL (ref 0.3–1.2)
Total Protein: 6.8 g/dL (ref 6.5–8.1)

## 2016-10-07 LAB — CBC WITH DIFFERENTIAL/PLATELET
Basophils Absolute: 0 10*3/uL (ref 0.0–0.1)
Basophils Relative: 0 %
EOS ABS: 0.1 10*3/uL (ref 0.0–0.7)
EOS PCT: 2 %
HCT: 35.6 % — ABNORMAL LOW (ref 39.0–52.0)
Hemoglobin: 12 g/dL — ABNORMAL LOW (ref 13.0–17.0)
LYMPHS ABS: 1 10*3/uL (ref 0.7–4.0)
Lymphocytes Relative: 14 %
MCH: 29.9 pg (ref 26.0–34.0)
MCHC: 33.7 g/dL (ref 30.0–36.0)
MCV: 88.8 fL (ref 78.0–100.0)
Monocytes Absolute: 0.3 10*3/uL (ref 0.1–1.0)
Monocytes Relative: 4 %
Neutro Abs: 5.4 10*3/uL (ref 1.7–7.7)
Neutrophils Relative %: 80 %
PLATELETS: 211 10*3/uL (ref 150–400)
RBC: 4.01 MIL/uL — AB (ref 4.22–5.81)
RDW: 14.5 % (ref 11.5–15.5)
WBC: 6.8 10*3/uL (ref 4.0–10.5)

## 2016-10-07 LAB — URINE MICROSCOPIC-ADD ON

## 2016-10-07 LAB — URINALYSIS, ROUTINE W REFLEX MICROSCOPIC
Bilirubin Urine: NEGATIVE
GLUCOSE, UA: NEGATIVE mg/dL
KETONES UR: NEGATIVE mg/dL
Leukocytes, UA: NEGATIVE
Nitrite: NEGATIVE
PROTEIN: NEGATIVE mg/dL
Specific Gravity, Urine: 1.025 (ref 1.005–1.030)
pH: 6.5 (ref 5.0–8.0)

## 2016-10-07 LAB — TSH: TSH: 2.124 u[IU]/mL (ref 0.350–4.500)
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
## 2016-10-07 NOTE — BH Assessment (Addendum)
Informed patients nurse that patients daughter is the guardian and would like to be updated with patients care. Patients daughter also states that she would like to be notified regarding the guardianship paperwork is on file or not.   Nicholas PokeJoVea Kaleena Corrow, LCSW Therapeutic Triage Specialist  [facility] 10/07/2016 6:54 PM

## 2016-10-07 NOTE — BH Assessment (Signed)
Assessment completed. Consulted with Alberteen SamFran Hobson, NP who recommends inpatient treatment.   Davina PokeJoVea Melvena Vink, LCSW Therapeutic Triage Specialist [facility] 10/07/2016 6:51 PM
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
## 2016-10-07 NOTE — ED Notes (Signed)
[facility]
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
## 2016-10-07 NOTE — ED Notes (Signed)
Bed: WA29 Expected date:  Expected time:  Means of arrival:  Comments: 
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
## 2016-10-08 DIAGNOSIS — Z8042 Family history of malignant neoplasm of prostate: Secondary | ICD-10-CM

## 2016-10-08 DIAGNOSIS — G308 Other Alzheimer's disease: Secondary | ICD-10-CM | POA: Diagnosis not present

## 2016-10-08 DIAGNOSIS — Z87891 Personal history of nicotine dependence: Secondary | ICD-10-CM

## 2016-10-08 DIAGNOSIS — Z79899 Other long term (current) drug therapy: Secondary | ICD-10-CM

## 2016-10-08 DIAGNOSIS — Z818 Family history of other mental and behavioral disorders: Secondary | ICD-10-CM

## 2016-10-08 DIAGNOSIS — F0281 Dementia in other diseases classified elsewhere with behavioral disturbance: Secondary | ICD-10-CM | POA: Diagnosis not present

## 2016-10-08 DIAGNOSIS — Z8249 Family history of ischemic heart disease and other diseases of the circulatory system: Secondary | ICD-10-CM | POA: Diagnosis not present

## 2016-10-08 DIAGNOSIS — Z833 Family history of diabetes mellitus: Secondary | ICD-10-CM

## 2016-10-08 DIAGNOSIS — R531 Weakness: Secondary | ICD-10-CM | POA: Diagnosis not present

## 2016-10-08 DIAGNOSIS — R4182 Altered mental status, unspecified: Secondary | ICD-10-CM | POA: Diagnosis not present

## 2016-10-08 MED ORDER — MEMANTINE HCL 5 MG PO TABS: 5.0000 mg | ORAL_TABLET | Freq: Two times a day (BID) | ORAL | Status: DC

## 2016-10-08 MED ORDER — GABAPENTIN 100 MG PO CAPS: 100.0000 mg | ORAL_CAPSULE | Freq: Every day | ORAL | Status: DC

## 2016-10-08 MED ORDER — SERTRALINE HCL 50 MG PO TABS
100.0000 mg | ORAL_TABLET | Freq: Every morning | ORAL | Status: DC
  Administered 2016-10-08: 100 mg via ORAL
  Filled 2016-10-08: qty 2

## 2016-10-08 MED ORDER — GABAPENTIN 300 MG PO CAPS: 1000.0000 mg | ORAL_CAPSULE | Freq: Every day | ORAL | Status: DC

## 2016-10-08 MED ORDER — DOXAZOSIN MESYLATE 4 MG PO TABS: 4.0000 mg | ORAL_TABLET | Freq: Every day | ORAL | Status: DC

## 2016-10-08 MED ORDER — QUETIAPINE FUMARATE 100 MG PO TABS
200.0000 mg | ORAL_TABLET | Freq: Every day | ORAL | Status: DC
Start: 2016-10-08 — End: 2016-10-08

## 2016-10-08 MED ORDER — LEVETIRACETAM 500 MG PO TABS: 1000.0000 mg | ORAL_TABLET | Freq: Every day | ORAL | Status: DC

## 2016-10-08 MED ORDER — LEVETIRACETAM 250 MG PO TABS
750.0000 mg | ORAL_TABLET | ORAL | Status: DC
Start: 2016-10-08 — End: 2016-10-08
  Filled 2016-10-08: qty 3

## 2016-10-08 MED ORDER — GABAPENTIN 400 MG PO CAPS
800.0000 mg | ORAL_CAPSULE | ORAL | Status: DC
Start: 2016-10-08 — End: 2016-10-08
  Administered 2016-10-08: 800 mg via ORAL
  Filled 2016-10-08: qty 2

## 2016-10-08 MED ORDER — LISINOPRIL 5 MG PO TABS
5.0000 mg | ORAL_TABLET | Freq: Every day | ORAL | Status: DC
  Filled 2016-10-08: qty 1

## 2016-10-08 MED ORDER — DONEPEZIL HCL 5 MG PO TABS: 10.0000 mg | ORAL_TABLET | Freq: Every day | ORAL | Status: DC

## 2016-10-08 MED ORDER — QUETIAPINE FUMARATE 50 MG PO TABS
50.0000 mg | ORAL_TABLET | Freq: Every day | ORAL | Status: DC
Start: 2016-10-08 — End: 2016-10-08

## 2016-10-08 NOTE — [facility] Suicide Risk Assessment (Signed)
Suicide Risk Assessment  Discharge Assessment   [facility]   Principal Problem: Alzheimer's dementia with behavioral disturbance Discharge Diagnoses:  Patient Active Problem List   Diagnosis Date Noted  . Episodic mood disorder (HCC) [F39] 11/09/2014  . Tremor [R25.1] 07/08/2014  . Alzheimer's dementia with behavioral disturbance [G30.8, F02.81]   . Routine general medical examination at a health care facility [Z00.00] 10/11/2012  . Vitamin B12 deficiency [E53.8] 02/20/2011  . HYPERLIPIDEMIA [E78.5] 02/13/2008  . ALLERGIC RHINITIS [J30.9] 02/13/2008  . DIVERTICULOSIS, COLON [K57.30] 02/13/2008  . BPH (benign prostatic hypertrophy) [N40.0] 02/13/2008  . 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 ACTINIC KERATOSIS [L57.0] 02/13/2008    Total Time spent with patient: 15 minutes  Musculoskeletal: Strength & Muscle Tone: unable to assess; evaluation while in bed Gait & Station: unable to assess; evaluation while in bed Patient leans: unable to assess; evaluation while in bed  Psychiatric Specialty Exam:   Blood pressure 133/70, pulse 66, temperature 98.9 F (37.2 C), temperature source Oral, resp. rate 18, SpO2 97 %.There is no height or weight on file to calculate BMI.   General Appearance: Fairly Groomed  Eye Contact:  Poor  Speech:  Clear  Volume:  Decreased  Mood:  does not appear angry anxious or depressed  Affect:  Congruent  Thought Process:  Disorganized  Orientation:  Other:  disoriented to person, place and time  Thought Content:  unable to assess  Suicidal Thoughts:  unable to assess; no gestures of self-harm  Homicidal Thoughts:  unable to assess  Memory:  unable to assess  Judgement:  Other:  unable to assess  Insight:  unable to assess  Psychomotor Activity:  Decreased  Concentration:  Concentration: Poor and Attention Span: Poor  Recall:  unable to assess  Fund of Knowledge:  unable to assess  Language:  Poor   Akathisia:  No  Handed:  Right  AIMS (if indicated):     Assets:  Financial Resources/Insurance Housing Resilience  ADL's:  Impaired  Cognition:  Impaired,  Moderate  Sleep:      Mental Status Per Nursing Assessment::   On Admission:     Demographic Factors:  Male, Age 75 or older and Caucasian  Loss Factors: Decline in physical health  Historical Factors: NA  Risk Reduction Factors:   Living in memory care unit in SNF  Continued Clinical Symptoms:  Medical Diagnoses and Treatments/Surgeries  Cognitive Features That Contribute To Risk:  Loss of executive function    Suicide Risk:  Minimal: No identifiable suicidal ideation.  Patients presenting with no risk factors but with morbid ruminations; may be classified as minimal risk based on the severity of the depressive symptoms     Plan Of Care/Follow-up recommendations:  Activity:  As tolerated Diet:  Regular Tests:  As determined by PCP    Alberteen SamFran Rosemary Pentecost, FNP-BC [facility] 10/08/2016, 12:37 PM
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
## 2016-10-08 NOTE — Progress Notes (Signed)
CSW contacted patient's daughter to inform patient's daughter that patient was being discharged. CSW received no answer, left message requesting return phone call.  CSW received return phone call from patient's daughter. CSW informed patient's daughter that patient was being discharged back to facility. Patient's daughter requested information regarding patient's care, CSW informed patient's daughter that she was unable to provide that information and suggested that the patient's daughter contact patient's RN in attempt to receive requested information.

## 2016-10-08 NOTE — Progress Notes (Signed)
CSW notified by RN that [facility] brought patient back to the ED after being unable to get patient back into the facility. CSW contacted [facility] and spoke with employee named Alona BeneJoyce, Alona BeneJoyce reported that she is the only employee on a locked unit and is unable to come down to get patient. Alona BeneJoyce reported that patient was brought into ED by non-emergency transportation and needed to arrive back to the facility by non-emergency transportation. Alona BeneJoyce noted that patient needed to come back to the 4th floor. CSW informed Alona BeneJoyce that CSW was unaware that patient needed non-emergency transporation and that patient would be transported back to facility via [facility].   CSW spoke with [facility] employee and charge nurse regarding patient's situation. Charge nurse contacted [facility] to coordinate transportation and escorted patient back to room to wait for [facility] to arrive.

## 2016-10-08 NOTE — Progress Notes (Signed)
CSW did not receive return phone call from [facility]. CSW contacted [facility] and spoke with employee named Alona BeneJoyce. CSW informed Alona BeneJoyce that CSW did not receive a return phone call and that patient would be returning to facility via [facility] upon discharge, Alona BeneJoyce replied okay.

## 2016-10-08 NOTE — Progress Notes (Signed)
CSW contacted [facility] [phone] to inform facility that patient was being discharged and would be returning to the facility. CSW informed employee, employee reported that employee would be relaying information to supervisor due to uncertainty of patient's ability to return. CSW informed employee that patient would be returning to facility upon discharge. Employee reported that CSW would be receiving phone call from supervisor.

## 2016-10-09 DIAGNOSIS — F918 Other conduct disorders: Secondary | ICD-10-CM | POA: Diagnosis not present

## 2016-10-09 DIAGNOSIS — F0281 Dementia in other diseases classified elsewhere with behavioral disturbance: Secondary | ICD-10-CM | POA: Diagnosis not present

## 2016-10-09 DIAGNOSIS — G309 Alzheimer's disease, unspecified: Secondary | ICD-10-CM | POA: Diagnosis not present

## 2016-10-09 DIAGNOSIS — F329 Major depressive disorder, single episode, unspecified: Secondary | ICD-10-CM | POA: Diagnosis not present

## 2016-10-09 DIAGNOSIS — N401 Enlarged prostate with lower urinary tract symptoms: Secondary | ICD-10-CM | POA: Diagnosis not present

## 2016-10-09 DIAGNOSIS — I10 Essential (primary) hypertension: Secondary | ICD-10-CM | POA: Diagnosis not present

## 2016-10-09 DIAGNOSIS — F419 Anxiety disorder, unspecified: Secondary | ICD-10-CM | POA: Diagnosis not present

## 2016-10-09 DIAGNOSIS — R269 Unspecified abnormalities of gait and mobility: Secondary | ICD-10-CM | POA: Diagnosis not present

## 2016-10-10 ENCOUNTER — Emergency Department (HOSPITAL_COMMUNITY)
Admission: EM | Admit: 2016-10-10 | Discharge: 2016-10-10 | Disposition: A | Discharge status: Home / Self Care | Payer: PPO | Attending: Emergency Medicine | Admitting: Emergency Medicine

## 2016-10-10 ENCOUNTER — Emergency Department (HOSPITAL_COMMUNITY): Payer: PPO

## 2016-10-10 ENCOUNTER — Encounter (HOSPITAL_COMMUNITY): Payer: Self-pay

## 2016-10-10 DIAGNOSIS — S0101XA Laceration without foreign body of scalp, initial encounter: Secondary | ICD-10-CM | POA: Insufficient documentation

## 2016-10-10 DIAGNOSIS — Z23 Encounter for immunization: Secondary | ICD-10-CM | POA: Insufficient documentation

## 2016-10-10 DIAGNOSIS — G309 Alzheimer's disease, unspecified: Secondary | ICD-10-CM | POA: Insufficient documentation

## 2016-10-10 DIAGNOSIS — W06XXXA Fall from bed, initial encounter: Secondary | ICD-10-CM | POA: Diagnosis not present

## 2016-10-10 DIAGNOSIS — Z79899 Other long term (current) drug therapy: Secondary | ICD-10-CM | POA: Insufficient documentation

## 2016-10-10 DIAGNOSIS — J45909 Unspecified asthma, uncomplicated: Secondary | ICD-10-CM | POA: Diagnosis not present

## 2016-10-10 DIAGNOSIS — S0990XA Unspecified injury of head, initial encounter: Secondary | ICD-10-CM | POA: Diagnosis not present

## 2016-10-10 DIAGNOSIS — E041 Nontoxic single thyroid nodule: Secondary | ICD-10-CM | POA: Diagnosis not present

## 2016-10-10 DIAGNOSIS — Y999 Unspecified external cause status: Secondary | ICD-10-CM | POA: Diagnosis not present

## 2016-10-10 DIAGNOSIS — Y929 Unspecified place or not applicable: Secondary | ICD-10-CM | POA: Insufficient documentation

## 2016-10-10 DIAGNOSIS — Y9389 Activity, other specified: Secondary | ICD-10-CM | POA: Diagnosis not present

## 2016-10-10 DIAGNOSIS — S199XXA Unspecified injury of neck, initial encounter: Secondary | ICD-10-CM | POA: Diagnosis not present

## 2016-10-10 DIAGNOSIS — S0191XA Laceration without foreign body of unspecified part of head, initial encounter: Secondary | ICD-10-CM | POA: Diagnosis not present

## 2016-10-10 DIAGNOSIS — W19XXXA Unspecified fall, initial encounter: Secondary | ICD-10-CM

## 2016-10-10 DIAGNOSIS — R259 Unspecified abnormal involuntary movements: Secondary | ICD-10-CM | POA: Diagnosis not present

## 2016-10-10 MED ORDER — TETANUS-DIPHTH-ACELL PERTUSSIS 5-2.5-18.5 LF-MCG/0.5 IM SUSP
0.5000 mL | Freq: Once | INTRAMUSCULAR | Status: AC
  Administered 2016-10-10: 0.5 mL via INTRAMUSCULAR
  Filled 2016-10-10: qty 0.5

## 2016-10-10 NOTE — ED Notes (Signed)
Pt had an unwitnessed fall, he has a laceration above his right eye. Pt has dementia and doesn't recall the incident

## 2016-10-10 NOTE — Discharge Instructions (Signed)
Your tetanus shot was updated today. You had a CT scan of your head and cervical spine today which did not show any trauma from your fall. Incidentally, a thyroid nodule was seen. You will need to follow up with your primary care doctor for further evaluation of this finding.

## 2016-10-10 NOTE — ED Notes (Signed)
Pt threw up 1x. PA came by and saw pt again. Thinks post-concussive symptoms. Continue to D/c

## 2016-10-10 NOTE — ED Provider Notes (Signed)
[facility]-EMERGENCY DEPT Provider Note   CSN: [id number] Arrival date & time: 10/10/16  4098     History   Chief Complaint Chief Complaint  Patient presents with  . Fall    HPI THAI BURGUENO is a 75 y.o. male.  The history is provided by the patient and medical records. No language interpreter was used.  Fall    Mostafa Yuan Stann is a 75 y.o. male  who presents to the Emergency Department from [facility] facility for evaluation after an unwitnessed fall. Patient states he tried to get out of bed during the night and fell, hitting his head. Medication list provided by facility does not list any blood-thinning meds. He denies any pain or complaints at this time, however level V caveat applies due to dementia.  Past Medical History:  Diagnosis Date  . Allergy   . Alzheimer's dementia 2014  . Asthma   . BPH (benign prostatic hypertrophy)   . Diverticulitis   . ED (erectile dysfunction)   . History of nephrolithiasis   . Hyperlipidemia   . Peyronie's disease     Patient Active Problem List   Diagnosis Date Noted  . Episodic mood disorder (HCC) 11/09/2014  . Tremor 07/08/2014  . Alzheimer's dementia with behavioral disturbance   . Routine general medical examination at a health care facility 10/11/2012  . Vitamin B12 deficiency 02/20/2011  . HYPERLIPIDEMIA 02/13/2008  . ALLERGIC RHINITIS 02/13/2008  . DIVERTICULOSIS, COLON 02/13/2008  . BPH (benign prostatic hypertrophy) 02/13/2008  . 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 ACTINIC KERATOSIS 02/13/2008    Past Surgical History:  Procedure Laterality Date  . TONSILLECTOMY         Home Medications    Prior to Admission medications   Medication Sig Start Date End Date Taking? Authorizing Provider  cholecalciferol (VITAMIN D) 1000 units tablet Take 2,000 Units by mouth every morning.     Historical Provider, MD  donepezil (ARICEPT) 10 MG tablet Take 10 mg by mouth at bedtime.    Historical Provider, MD  doxazosin  (CARDURA) 4 MG tablet Take 1 tablet (4 mg total) by mouth at bedtime. 06/01/15   Karie Schwalbe, MD  folic acid (FOLVITE) 1 MG tablet Take 1 mg by mouth every morning.     Historical Provider, MD  gabapentin (NEURONTIN) 400 MG capsule Take 800 mg by mouth 3 (three) times daily. Take 800mg  in the morning, 800mg  at 2pm, and 400mg  at bedtime (with 600mg  tablet for total bedtime dose of 1000mg )    Historical Provider, MD  gabapentin (NEURONTIN) 600 MG tablet Take 600 mg by mouth at bedtime. Take at bedtime with 400mg  tablet (for total bedtime dose 1000mg )    Historical Provider, MD  levETIRAcetam (KEPPRA) 1000 MG tablet Take 1,000 mg by mouth at bedtime.    Historical Provider, MD  levETIRAcetam (KEPPRA) 750 MG tablet Take 750 mg by mouth 2 (two) times daily. At 8AM and 4PM    Historical Provider, MD  lisinopril (PRINIVIL,ZESTRIL) 5 MG tablet Take 1 tablet (5 mg total) by mouth daily. 03/03/16   Sharman Cheek, MD  memantine (NAMENDA) 5 MG tablet Take 1 tablet (5 mg total) by mouth 2 (two) times daily. 03/03/16   Sharman Cheek, MD  Multiple Vitamin (MULTIVITAMIN WITH MINERALS) TABS tablet Take 1 tablet by mouth every morning.     Historical Provider, MD  Omega-3 Fatty Acids (FISH OIL) 1000 MG CAPS Take 1,000 mg by mouth every morning.    Historical Provider, MD  psyllium (REGULOID) 0.52 g capsule Take 0.52 g by mouth daily.    Historical Provider, MD  QUEtiapine (SEROQUEL) 200 MG tablet Take 200 mg by mouth at bedtime. Take with 50mg  tablet for total nightly dose 250mg     Historical Provider, MD  QUEtiapine (SEROQUEL) 50 MG tablet Take 50 mg by mouth at bedtime. Take with 200mg  tablet for total nightly dose 250mg     Historical Provider, MD  sertraline (ZOLOFT) 100 MG tablet Take 100 mg by mouth every morning.     Historical Provider, MD  vitamin B-12 (CYANOCOBALAMIN) 1000 MCG tablet Take 1,000 mcg by mouth every morning.    Historical Provider, MD    Family History Family History  Problem Relation  Age of Onset  . Alzheimer's disease Mother   . Alcohol abuse Father   . Cancer Father     prostate cancer  . Coronary artery disease Neg Hx   . Hypertension Neg Hx   . Diabetes Neg Hx     Social History Social History  Substance Use Topics  . Smoking status: Former Games developermoker  . Smokeless tobacco: Never Used  . Alcohol use No     Allergies   Penicillins   Review of Systems Review of Systems  Unable to perform ROS: Dementia     Physical Exam Updated Vital Signs BP 121/74   Pulse 72   Temp 98.4 F (36.9 C) (Oral)   Resp 16   SpO2 99%   Physical Exam  Constitutional: He appears well-developed and well-nourished. No distress.  HENT:  Head: Normocephalic. Head is without raccoon's eyes and without Battle's sign.    Right Ear: No hemotympanum.  Left Ear: No hemotympanum.  Nose: Nose normal.  Cardiovascular: Normal rate, regular rhythm, normal heart sounds and intact distal pulses.   No murmur heard. Pulmonary/Chest: Effort normal and breath sounds normal. No respiratory distress. He exhibits no tenderness.  Abdominal: Soft. He exhibits no distension. There is no tenderness.  Musculoskeletal:  Moves all extremities well x 4. No C/T/L spine tenderness. No tenderness to palpation of upper or lower extremities.   Neurological:  Alert and oriented to person only which appears to be baseline.   Skin: Skin is warm and dry.  Nursing note and vitals reviewed.    ED Treatments / Results  Labs (all labs ordered are listed, but only abnormal results are displayed) Labs Reviewed - No data to display  EKG  EKG Interpretation None       Radiology Ct Head Wo Contrast  Result Date: 10/10/2016 CLINICAL DATA:  Unwitnessed fall. Laceration above the right eye. Alzheimer's dementia. EXAM: CT HEAD WITHOUT CONTRAST CT CERVICAL SPINE WITHOUT CONTRAST TECHNIQUE: Multidetector CT imaging of the head and cervical spine was performed following the standard protocol without  intravenous contrast. Multiplanar CT image reconstructions of the cervical spine were also generated. COMPARISON:  07/28/2016 FINDINGS: CT HEAD FINDINGS Brain: Generalized atrophy, more prominent in the temporoparietal regions. No sign of acute infarction, mass lesion, hemorrhage, hydrocephalus or extra-axial collection. Vascular: No abnormal vascular finding. Skull: No skull fracture. Sinuses/Orbits: No fluid in the sinuses, middle ears or mastoids. Scattered opacified ethmoid air cells of no significance. Other: None significant CT CERVICAL SPINE FINDINGS Alignment: No post traumatic malalignment. 2 mm of degenerative anterolisthesis at C4-5. Skull base and vertebrae: No fracture or focal lesion. Soft tissues and spinal canal: Negative except for a dominant thyroid nodule of the left lobe measuring up to 3.8 cm in diameter. Disc levels: Ordinary mild spondylosis at  C4-5, C5-6 and C6-7 with small uncovertebral osteophytes. Facet degeneration on the right at C3-4, C4-5 and C5-6 and on the left to a lesser degree at those levels. Upper chest: Ordinary benign scarring. Other: None significant IMPRESSION: Head CT: No acute or traumatic finding. Atrophy most pronounced in the temporal and parietal regions. Cervical spine CT: No acute or traumatic finding. Ordinary spondylosis and facet arthropathy. Re- demonstration of a dominant thyroid nodule on the left measuring 3.8 cm in diameter. Recommend further evaluation with thyroid ultrasound. If patient is clinically hyperthyroid, consider nuclear medicine thyroid uptake and scan. This recommendation may or may not be appropriate based on the patient's overall clinical status. Electronically Signed   By: Paulina FusiMark  Shogry M.D.   On: 10/10/2016 07:47   Ct Cervical Spine Wo Contrast  Result Date: 10/10/2016 CLINICAL DATA:  Unwitnessed fall. Laceration above the right eye. Alzheimer's dementia. EXAM: CT HEAD WITHOUT CONTRAST CT CERVICAL SPINE WITHOUT CONTRAST TECHNIQUE:  Multidetector CT imaging of the head and cervical spine was performed following the standard protocol without intravenous contrast. Multiplanar CT image reconstructions of the cervical spine were also generated. COMPARISON:  07/28/2016 FINDINGS: CT HEAD FINDINGS Brain: Generalized atrophy, more prominent in the temporoparietal regions. No sign of acute infarction, mass lesion, hemorrhage, hydrocephalus or extra-axial collection. Vascular: No abnormal vascular finding. Skull: No skull fracture. Sinuses/Orbits: No fluid in the sinuses, middle ears or mastoids. Scattered opacified ethmoid air cells of no significance. Other: None significant CT CERVICAL SPINE FINDINGS Alignment: No post traumatic malalignment. 2 mm of degenerative anterolisthesis at C4-5. Skull base and vertebrae: No fracture or focal lesion. Soft tissues and spinal canal: Negative except for a dominant thyroid nodule of the left lobe measuring up to 3.8 cm in diameter. Disc levels: Ordinary mild spondylosis at C4-5, C5-6 and C6-7 with small uncovertebral osteophytes. Facet degeneration on the right at C3-4, C4-5 and C5-6 and on the left to a lesser degree at those levels. Upper chest: Ordinary benign scarring. Other: None significant IMPRESSION: Head CT: No acute or traumatic finding. Atrophy most pronounced in the temporal and parietal regions. Cervical spine CT: No acute or traumatic finding. Ordinary spondylosis and facet arthropathy. Re- demonstration of a dominant thyroid nodule on the left measuring 3.8 cm in diameter. Recommend further evaluation with thyroid ultrasound. If patient is clinically hyperthyroid, consider nuclear medicine thyroid uptake and scan. This recommendation may or may not be appropriate based on the patient's overall clinical status. Electronically Signed   By: Paulina FusiMark  Shogry M.D.   On: 10/10/2016 07:47    Procedures Procedures (including critical care time)  LACERATION REPAIR Performed by: Chase PicketJaime Pilcher  Omar Orrego Authorized by: Chase PicketJaime Pilcher Lynnda Wiersma Consent: Verbal consent obtained. Risks and benefits: risks, benefits and alternatives were discussed Consent given by: patient Patient identity confirmed: provided demographic data Prepped and Draped in normal sterile fashion Wound explored Laceration Location: Left eyebrow Laceration Length: 1cm No Foreign Bodies seen or palpated Anesthesia: none Irrigation method: syringe Amount of cleaning: standard Skin closure: dermabond Patient tolerance: Patient tolerated the procedure well with no immediate complications.  Medications Ordered in ED Medications  Tdap (BOOSTRIX) injection 0.5 mL (0.5 mLs Intramuscular Given 10/10/16 0731)     Initial Impression / Assessment and Plan / ED Course  I have reviewed the triage vital signs and the nursing notes.  Pertinent labs & imaging results that were available during my care of the patient were reviewed by me and considered in my medical decision making (see chart for details).  Clinical  Course   ANKIT DEGREGORIO is a 75 y.o. male who presents to ED for evaluation after an unwitnessed fall at nursing facility. He has a small laceration to left eyebrow but no other evidence of trauma. Tetanus updated. Laceration repaired with dermabond as dictated above. CT head/c-spine negative for acute injuries. Incidental thyroid nodule seen on CT - outpatient follow up appropriate. Information provided in discharge paperwork for facility. Safe for discharge back to facility.   Patient seen by and discussed with Dr. Ranae Palms who agrees with treatment plan.   Final Clinical Impressions(s) / ED Diagnoses   Final diagnoses:  Fall  Thyroid nodule  Laceration of scalp, initial encounter    New Prescriptions New Prescriptions   No medications on file     [facility] Maygan Koeller, PA-C 10/10/16 1610    Loren Racer, MD 10/13/16 [phone]

## 2016-10-10 NOTE — ED Notes (Signed)
Patient transported to CT 

## 2016-10-11 DIAGNOSIS — G309 Alzheimer's disease, unspecified: Secondary | ICD-10-CM | POA: Diagnosis not present

## 2016-10-11 DIAGNOSIS — S0195XA Open bite of unspecified part of head, initial encounter: Secondary | ICD-10-CM | POA: Diagnosis not present

## 2016-10-11 DIAGNOSIS — I10 Essential (primary) hypertension: Secondary | ICD-10-CM | POA: Diagnosis not present

## 2016-10-11 DIAGNOSIS — N401 Enlarged prostate with lower urinary tract symptoms: Secondary | ICD-10-CM | POA: Diagnosis not present

## 2016-10-11 DIAGNOSIS — R233 Spontaneous ecchymoses: Secondary | ICD-10-CM | POA: Diagnosis not present

## 2016-10-11 DIAGNOSIS — R456 Violent behavior: Secondary | ICD-10-CM | POA: Diagnosis not present

## 2016-10-12 DIAGNOSIS — F321 Major depressive disorder, single episode, moderate: Secondary | ICD-10-CM | POA: Diagnosis not present

## 2016-10-12 DIAGNOSIS — F411 Generalized anxiety disorder: Secondary | ICD-10-CM | POA: Diagnosis not present

## 2016-10-12 DIAGNOSIS — G4700 Insomnia, unspecified: Secondary | ICD-10-CM | POA: Diagnosis not present

## 2016-10-12 DIAGNOSIS — F062 Psychotic disorder with delusions due to known physiological condition: Secondary | ICD-10-CM | POA: Diagnosis not present

## 2016-10-17 ENCOUNTER — Emergency Department (HOSPITAL_COMMUNITY)
Admission: EM | Admit: 2016-10-17 | Discharge: 2016-10-17 | Disposition: A | Discharge status: Home / Self Care | Payer: PPO | Attending: Emergency Medicine | Admitting: Emergency Medicine

## 2016-10-17 ENCOUNTER — Encounter (HOSPITAL_COMMUNITY): Payer: Self-pay | Admitting: Emergency Medicine

## 2016-10-17 ENCOUNTER — Emergency Department (HOSPITAL_COMMUNITY): Payer: PPO

## 2016-10-17 DIAGNOSIS — W07XXXA Fall from chair, initial encounter: Secondary | ICD-10-CM | POA: Diagnosis not present

## 2016-10-17 DIAGNOSIS — S0190XA Unspecified open wound of unspecified part of head, initial encounter: Secondary | ICD-10-CM | POA: Diagnosis not present

## 2016-10-17 DIAGNOSIS — Z87891 Personal history of nicotine dependence: Secondary | ICD-10-CM | POA: Diagnosis not present

## 2016-10-17 DIAGNOSIS — S0181XA Laceration without foreign body of other part of head, initial encounter: Secondary | ICD-10-CM | POA: Diagnosis not present

## 2016-10-17 DIAGNOSIS — S0990XA Unspecified injury of head, initial encounter: Secondary | ICD-10-CM | POA: Diagnosis not present

## 2016-10-17 DIAGNOSIS — Y939 Activity, unspecified: Secondary | ICD-10-CM | POA: Diagnosis not present

## 2016-10-17 DIAGNOSIS — R259 Unspecified abnormal involuntary movements: Secondary | ICD-10-CM | POA: Diagnosis not present

## 2016-10-17 DIAGNOSIS — J45909 Unspecified asthma, uncomplicated: Secondary | ICD-10-CM | POA: Diagnosis not present

## 2016-10-17 DIAGNOSIS — R221 Localized swelling, mass and lump, neck: Secondary | ICD-10-CM | POA: Diagnosis not present

## 2016-10-17 DIAGNOSIS — Y999 Unspecified external cause status: Secondary | ICD-10-CM | POA: Insufficient documentation

## 2016-10-17 DIAGNOSIS — Z79899 Other long term (current) drug therapy: Secondary | ICD-10-CM | POA: Diagnosis not present

## 2016-10-17 DIAGNOSIS — R2689 Other abnormalities of gait and mobility: Secondary | ICD-10-CM | POA: Diagnosis not present

## 2016-10-17 DIAGNOSIS — W19XXXA Unspecified fall, initial encounter: Secondary | ICD-10-CM

## 2016-10-17 DIAGNOSIS — Y929 Unspecified place or not applicable: Secondary | ICD-10-CM | POA: Insufficient documentation

## 2016-10-17 DIAGNOSIS — S0101XA Laceration without foreign body of scalp, initial encounter: Secondary | ICD-10-CM | POA: Diagnosis not present

## 2016-10-17 DIAGNOSIS — R930 Abnormal findings on diagnostic imaging of skull and head, not elsewhere classified: Secondary | ICD-10-CM | POA: Diagnosis not present

## 2016-10-17 DIAGNOSIS — S098XXA Other specified injuries of head, initial encounter: Secondary | ICD-10-CM | POA: Diagnosis not present

## 2016-10-17 MED ORDER — BACITRACIN ZINC 500 UNIT/GM EX OINT
TOPICAL_OINTMENT | Freq: Two times a day (BID) | CUTANEOUS | Status: DC
  Filled 2016-10-17: qty 0.9

## 2016-10-17 MED ORDER — LIDOCAINE-EPINEPHRINE (PF) 2 %-1:200000 IJ SOLN
10.0000 mL | Freq: Once | INTRAMUSCULAR | Status: AC
  Administered 2016-10-17: 10 mL
  Filled 2016-10-17: qty 20

## 2016-10-17 NOTE — ED Provider Notes (Signed)
[facility]-EMERGENCY DEPT Provider Note   CSN: [id number] Arrival date & time: 10/17/16  0353     History   Chief Complaint Chief Complaint  Patient presents with  . Fall    witness fall 30 mins ago [facility]   . Head Laceration    bleeding controlled     HPI Nicholas Kennedy is a 75 y.o. male.  Nicholas Kennedy is a 75 y.o. male with h/o alzheimer's dementia at baseline, BPH, HLD, episodic mood disorder presents [facility] ED [facility] s/p fall approximately 30 minutes PTA. Per staff at facility patient was sitting in chair, "must have fallen asleep," and fell forward from the chair hitting his head on the ground. Denies LOC and that patient attempted to pick himself up on his own. He sustained a laceration to his right forehead. Wound was cleaned and dressing applied. EMS was subsequently and transported to ED. He is not on anti-coagulation therapy. No fever, SOB, diarrhea, or vomiting.   Level V caveat - alzheimer's dementia at baseline alert to self      Past Medical History:  Diagnosis Date  . Allergy   . Alzheimer's dementia 2014  . Asthma   . BPH (benign prostatic hypertrophy)   . Diverticulitis   . ED (erectile dysfunction)   . History of nephrolithiasis   . Hyperlipidemia   . Peyronie's disease     Patient Active Problem List   Diagnosis Date Noted  . Episodic mood disorder (HCC) 11/09/2014  . Tremor 07/08/2014  . Alzheimer's dementia with behavioral disturbance   . Routine general medical examination at a health care facility 10/11/2012  . Vitamin B12 deficiency 02/20/2011  . HYPERLIPIDEMIA 02/13/2008  . ALLERGIC RHINITIS 02/13/2008  . DIVERTICULOSIS, COLON 02/13/2008  . BPH (benign prostatic hypertrophy) 02/13/2008  . 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 ACTINIC KERATOSIS 02/13/2008    Past Surgical History:  Procedure Laterality Date  . TONSILLECTOMY         Home Medications    Prior to Admission medications   Medication Sig  Start Date End Date Taking? Authorizing Provider  cholecalciferol (VITAMIN D) 1000 units tablet Take 2,000 Units by mouth every morning.    Yes Historical Provider, MD  donepezil (ARICEPT) 10 MG tablet Take 10 mg by mouth at bedtime.   Yes Historical Provider, MD  doxazosin (CARDURA) 4 MG tablet Take 1 tablet (4 mg total) by mouth at bedtime. 06/01/15  Yes Karie Schwalbeichard I Letvak, MD  folic acid (FOLVITE) 1 MG tablet Take 1 mg by mouth every morning.    Yes Historical Provider, MD  gabapentin (NEURONTIN) 400 MG capsule Take 800 mg by mouth 3 (three) times daily. Take 800mg  in the morning, 800mg  at 2pm, and 400mg  at bedtime (with 600mg  tablet for total bedtime dose of 1000mg )   Yes Historical Provider, MD  gabapentin (NEURONTIN) 600 MG tablet Take 600 mg by mouth at bedtime. Take at bedtime with 400mg  tablet (for total bedtime dose 1000mg )   Yes Historical Provider, MD  levETIRAcetam (KEPPRA) 1000 MG tablet Take 1,000 mg by mouth at bedtime.   Yes Historical Provider, MD  levETIRAcetam (KEPPRA) 750 MG tablet Take 750 mg by mouth 2 (two) times daily. At 8AM and 4PM   Yes Historical Provider, MD  memantine (NAMENDA) 5 MG tablet Take 1 tablet (5 mg total) by mouth 2 (two) times daily. Patient taking differently: Take 5 mg by mouth daily.  03/03/16  Yes Sharman CheekPhillip Stafford, MD  Multiple Vitamin (  MULTIVITAMIN WITH MINERALS) TABS tablet Take 1 tablet by mouth every morning.    Yes Historical Provider, MD  Omega-3 Fatty Acids (FISH OIL) 1000 MG CAPS Take 1,000 mg by mouth every morning.   Yes Historical Provider, MD  QUEtiapine (SEROQUEL) 50 MG tablet Take 100 mg by mouth daily.    Yes Historical Provider, MD  senna (SENOKOT) 8.6 MG tablet Take 1 tablet by mouth daily.   Yes Historical Provider, MD  sertraline (ZOLOFT) 100 MG tablet Take 50 mg by mouth every morning.    Yes Historical Provider, MD  vitamin B-12 (CYANOCOBALAMIN) 1000 MCG tablet Take 1,000 mcg by mouth every morning.   Yes Historical Provider, MD    lisinopril (PRINIVIL,ZESTRIL) 5 MG tablet Take 1 tablet (5 mg total) by mouth daily. Patient not taking: Reported on 10/17/2016 03/03/16   Sharman Cheek, MD    Family History Family History  Problem Relation Age of Onset  . Alzheimer's disease Mother   . Alcohol abuse Father   . Cancer Father     prostate cancer  . Coronary artery disease Neg Hx   . Hypertension Neg Hx   . Diabetes Neg Hx     Social History Social History  Substance Use Topics  . Smoking status: Former Games developer  . Smokeless tobacco: Never Used  . Alcohol use No     Allergies   Penicillins   Review of Systems Review of Systems  Unable to perform ROS: Dementia     Physical Exam Updated Vital Signs BP 131/82 (BP Location: Left Arm)   Pulse 71   Temp 97.5 F (36.4 C) (Axillary)   Resp 17   SpO2 100%   Physical Exam  Constitutional: He appears ill ( chronically). No distress.  HENT:  Head: Normocephalic and atraumatic.  Mouth/Throat: Oropharynx is clear and moist. No oropharyngeal exudate.  Eyes: Conjunctivae and EOM are normal. Pupils are equal, round, and reactive to light. Right eye exhibits no discharge. Left eye exhibits no discharge. No scleral icterus.  Neck: Normal range of motion and phonation normal. Neck supple. No spinous process tenderness present. No neck rigidity. Normal range of motion present.  No midline spinal tenderness  Cardiovascular: Normal rate, regular rhythm, normal heart sounds and intact distal pulses.   No murmur heard. Pulmonary/Chest: Effort normal and breath sounds normal. No stridor. No respiratory distress. He has no wheezes. He has no rales.  Abdominal: Soft. Bowel sounds are normal. He exhibits no distension. There is no tenderness. There is no rigidity, no rebound, no guarding and no CVA tenderness.  Musculoskeletal: Normal range of motion.  No TTP on palpation of joints   Lymphadenopathy:    He has no cervical adenopathy.  Neurological: He is alert.  Skin:  Skin is warm and dry. Laceration noted. He is not diaphoretic.     Psychiatric: He has a normal mood and affect. His behavior is normal.      ED Treatments / Results  Labs (all labs ordered are listed, but only abnormal results are displayed) Labs Reviewed - No data to display  EKG  EKG Interpretation None       Radiology Ct Head Wo Contrast  Result Date: 10/17/2016 CLINICAL DATA:  Witnessed fall. No loss of consciousness. Laceration to the right forehead. History of dementia. EXAM: CT HEAD WITHOUT CONTRAST CT CERVICAL SPINE WITHOUT CONTRAST TECHNIQUE: Multidetector CT imaging of the head and cervical spine was performed following the standard protocol without intravenous contrast. Multiplanar CT image reconstructions of the  cervical spine were also generated. COMPARISON:  10/10/2016 FINDINGS: CT HEAD FINDINGS Brain: Diffuse cerebral atrophy. Ventricular dilatation consistent with central atrophy. Low-attenuation changes in the deep white matter consistent with small vessel ischemia. No evidence of acute infarction, hemorrhage, hydrocephalus, extra-axial collection or mass lesion/mass effect. Vascular: No hyperdense vessel or unexpected calcification. Skull: Normal. Negative for fracture or focal lesion. Sinuses/Orbits: No acute finding. Other: Small subcutaneous scalp hematoma and laceration over the right anterior frontal region. CT CERVICAL SPINE FINDINGS Alignment: Reversal of the usual cervical lordosis without change since prior study, likely positional or degenerative. No anterior subluxation. Normal alignment of facet joints. Skull base and vertebrae: No acute fracture. No primary bone lesion or focal pathologic process. Soft tissues and spinal canal: No prevertebral fluid or swelling. No visible canal hematoma. Disc levels: Degenerative changes throughout the cervical spine with narrowed interspaces and endplate hypertrophic changes. Degenerative changes throughout the facet joints.  Upper chest: Left thyroid mass measuring 3.3 cm diameter. No change. Other: None. IMPRESSION: No acute intracranial abnormalities. Diffuse atrophy and small vessel ischemic changes. Chronic reversal of the usual cervical lordosis. Degenerative changes throughout the cervical spine. No acute displaced fractures. Large left thyroid mass. Consider further evaluation with ultrasound if clinically indicated. Electronically Signed   By: Burman NievesWilliam  Stevens M.D.   On: 10/17/2016 05:48   Ct Cervical Spine Wo Contrast  Result Date: 10/17/2016 CLINICAL DATA:  Witnessed fall. No loss of consciousness. Laceration to the right forehead. History of dementia. EXAM: CT HEAD WITHOUT CONTRAST CT CERVICAL SPINE WITHOUT CONTRAST TECHNIQUE: Multidetector CT imaging of the head and cervical spine was performed following the standard protocol without intravenous contrast. Multiplanar CT image reconstructions of the cervical spine were also generated. COMPARISON:  10/10/2016 FINDINGS: CT HEAD FINDINGS Brain: Diffuse cerebral atrophy. Ventricular dilatation consistent with central atrophy. Low-attenuation changes in the deep white matter consistent with small vessel ischemia. No evidence of acute infarction, hemorrhage, hydrocephalus, extra-axial collection or mass lesion/mass effect. Vascular: No hyperdense vessel or unexpected calcification. Skull: Normal. Negative for fracture or focal lesion. Sinuses/Orbits: No acute finding. Other: Small subcutaneous scalp hematoma and laceration over the right anterior frontal region. CT CERVICAL SPINE FINDINGS Alignment: Reversal of the usual cervical lordosis without change since prior study, likely positional or degenerative. No anterior subluxation. Normal alignment of facet joints. Skull base and vertebrae: No acute fracture. No primary bone lesion or focal pathologic process. Soft tissues and spinal canal: No prevertebral fluid or swelling. No visible canal hematoma. Disc levels: Degenerative  changes throughout the cervical spine with narrowed interspaces and endplate hypertrophic changes. Degenerative changes throughout the facet joints. Upper chest: Left thyroid mass measuring 3.3 cm diameter. No change. Other: None. IMPRESSION: No acute intracranial abnormalities. Diffuse atrophy and small vessel ischemic changes. Chronic reversal of the usual cervical lordosis. Degenerative changes throughout the cervical spine. No acute displaced fractures. Large left thyroid mass. Consider further evaluation with ultrasound if clinically indicated. Electronically Signed   By: Burman NievesWilliam  Stevens M.D.   On: 10/17/2016 05:48    Procedures .Marland Kitchen.Laceration Repair Date/Time: 10/17/2016 5:32 PM Performed by: Lona KettleMEYER, ASHLEY LAUREL Authorized by: Lona KettleMEYER, ASHLEY LAUREL   Consent:    Consent obtained:  Verbal   Consent given by:  Patient Anesthesia (see MAR for exact dosages):    Anesthesia method:  Local infiltration   Local anesthetic:  Lidocaine 1% WITH epi Laceration details:    Location:  Scalp   Scalp location:  Frontal   Length (cm):  2.5   Depth (mm):  3 Repair type:    Repair type:  Simple Pre-procedure details:    Preparation:  Patient was prepped and draped in usual sterile fashion and imaging obtained to evaluate for foreign bodies Exploration:    Hemostasis achieved with:  Epinephrine and direct pressure   Wound exploration: wound explored through full range of motion and entire depth of wound probed and visualized     Wound extent: no foreign bodies/material noted     Contaminated: no   Treatment:    Area cleansed with:  Saline   Amount of cleaning:  Standard   Irrigation solution:  Sterile saline   Irrigation volume:  500   Irrigation method:  Syringe   Foreign body removal: no foreign bodies.   Skin repair:    Repair method:  Sutures   Suture size:  6-0   Suture material:  Prolene   Suture technique:  Simple interrupted   Number of sutures:  2 Approximation:    Approximation:   Close   Vermilion border: well-aligned   Post-procedure details:    Dressing:  Antibiotic ointment and sterile dressing   Patient tolerance of procedure:  Tolerated well, no immediate complications   (including critical care time)  Medications Ordered in ED Medications  lidocaine-EPINEPHrine (XYLOCAINE W/EPI) 2 %-1:200000 (PF) injection 10 mL (10 mLs Infiltration Given 10/17/16 0554)     Initial Impression / Assessment and Plan / ED Course  I have reviewed the triage vital signs and the nursing notes.  Pertinent labs & imaging results that were available during my care of the patient were reviewed by me and considered in my medical decision making (see chart for details).  Clinical Course as of Oct 17 1734  Tue Oct 17, 2016  0600 Reviewed CT Head Wo Contrast [AM]  0600 Reviewed CT Cervical Spine Wo Contrast [AM]    Clinical Course User Index [AM] Lona Kettle, PA-C    Patient presents to ED s/p witnessed fall from chair approximately 30 min PTA. No LOC. No anti-coagulation therapy. Per facility staff patient at baseline. Patient is afebrile and non-toxic appearing in NAD. VSS. No TTP of cervical spine. 1in laceration to right forehead with underlying hematoma. No battle sign or raccoon eyes. Lungs CTABL. Heart RRR. Abdomen +BS, soft, non-distended, non-tender. Given age and alzheimer's will scan head and neck to r/o acute pathology. Patient is on multiple sedating medicatons to include ativan, trazadone, gabapentin, and seroquil - ?possible over-medicated contributing to fall.   CT head and neck shows no acute pathology; of incidental finding is left thyroid mass, OP [place] recommended, encouraged in d/c paperwork OP [place] for further evaluation. Patient is ambulatory with steady gait, low suspicion for hip fracture.   Wound irrigated. Wound explored and base of wound visualized in a bloodless field without evidence of foreign body.  Laceration occurred < 8 hours prior to repair which  was well tolerated.Tdap UTD.  Pt has no comorbidities to effect normal wound healing. Pt discharged  without antibiotics. Sutures to be removed in 5 days. D/c paperwork includes wound care instructions; return precautions given and return to ED sooner for signs of infection. Patient is on multiple sedating medications which may have contributed to fall, recommend d/c ativan. PCP follow up for medication reconciliation and re-evaluation. Patient is hemodynamically stable with no complaints. Patient is safe for discharge back to facility.    Discussed patient with Dr. Bebe Shaggy, who also saw patient, agrees with plan.     MDM Number of Diagnoses or  Management Options Fall, initial encounter:  Laceration of scalp without foreign body, initial encounter:    Final Clinical Impressions(s) / ED Diagnoses   Final diagnoses:  Fall, initial encounter  Laceration of scalp without foreign body, initial encounter    New Prescriptions Discharge Medication List as of 10/17/2016  7:54 AM       Lona Kettle, PA-C 10/17/16 1745    Zadie Rhine, MD 10/17/16 2311

## 2016-10-17 NOTE — ED Triage Notes (Signed)
Pt comes to ed, via ems, recent fall tonight at 3am, witnessed by facility, lac on head 1.5 in size, no LOC. No blood thinners. Hx of dementia.  V/s cbg 171, v/s 92/60, rr16 spo2 95, pulse 64. BPH, psych hx, alzheimer. Able to follow some commands, alert to person. Has a hx of aggression per facility. Baseline currently. Resting room.   Allergic to PCN,  Legal guardian tara pitt, facilty called her to and made aware. Hx of HTN,

## 2016-10-17 NOTE — Discharge Instructions (Signed)
Read the information below.  No obvious fracture, dislocation, or hemorrhage on imaging. Of incidental finding is a left thyroid nodule. Outpatient US is recommended.  Multiple medications that have sedating qualities may have contributed to fall - discontinue ativan. Follow up with primary doctor regarding medication adjustment to decrease sedation.  Two stitches were placed in the wound above the right eye. Keep bandage on for 24 hours. Following can wash with warm soap and water, reapply antibiotic ointment, and a new dressing. Watch for signs of infection  - redness, swelling, fever, purulent drainage - if any symptoms please return to the hospital immediately.  Stitches need to be removed in approximately 5 days.  Use the prescribed medication as directed.  Please discuss all new medications with your pharmacist.   You may return to the Emergency Department at any time for worsening condition or any new symptoms that concern you.

## 2016-10-17 NOTE — ED Notes (Signed)
Bed: [id number] Expected date:  Expected time:  Means of arrival:  Comments: 75 yo M/ Fall

## 2016-10-17 NOTE — ED Notes (Signed)
Provider notified of Fall pt

## 2016-10-17 NOTE — ED Notes (Signed)
Patient ambulated to bathroom with standby assistance.

## 2016-10-17 NOTE — ED Notes (Signed)
Pt in CT.

## 2016-10-17 NOTE — ED Provider Notes (Signed)
Patient seen/examined in the Emergency Department in conjunction with Midlevel Provider  Patient presents after fall.  He sustained laceration to forehead Exam : resting but arousable, maex4, he has no focal trauma to his extremities Plan: imaging negative.  He will require suturing of his wounds Pt may also be overmedicated, and will need to have this addressed at his facility.    Zadie Rhineonald Trampus Mcquerry, MD 10/17/16 0600

## 2016-10-18 DIAGNOSIS — R609 Edema, unspecified: Secondary | ICD-10-CM | POA: Diagnosis not present

## 2016-10-18 DIAGNOSIS — S0182XA Laceration with foreign body of other part of head, initial encounter: Secondary | ICD-10-CM | POA: Diagnosis not present

## 2016-10-18 DIAGNOSIS — F0391 Unspecified dementia with behavioral disturbance: Secondary | ICD-10-CM | POA: Diagnosis not present

## 2016-10-18 DIAGNOSIS — W19XXXA Unspecified fall, initial encounter: Secondary | ICD-10-CM | POA: Diagnosis not present

## 2016-10-18 DIAGNOSIS — N401 Enlarged prostate with lower urinary tract symptoms: Secondary | ICD-10-CM | POA: Diagnosis not present

## 2016-10-18 DIAGNOSIS — I959 Hypotension, unspecified: Secondary | ICD-10-CM | POA: Diagnosis not present

## 2016-10-18 DIAGNOSIS — S0101XD Laceration without foreign body of scalp, subsequent encounter: Secondary | ICD-10-CM | POA: Diagnosis not present

## 2016-10-19 DIAGNOSIS — Z87891 Personal history of nicotine dependence: Secondary | ICD-10-CM | POA: Diagnosis not present

## 2016-10-19 DIAGNOSIS — G309 Alzheimer's disease, unspecified: Secondary | ICD-10-CM | POA: Diagnosis not present

## 2016-10-19 DIAGNOSIS — F0391 Unspecified dementia with behavioral disturbance: Secondary | ICD-10-CM | POA: Diagnosis not present

## 2016-10-19 DIAGNOSIS — F0281 Dementia in other diseases classified elsewhere with behavioral disturbance: Secondary | ICD-10-CM | POA: Diagnosis not present

## 2016-10-19 DIAGNOSIS — N529 Male erectile dysfunction, unspecified: Secondary | ICD-10-CM | POA: Diagnosis not present

## 2016-10-19 DIAGNOSIS — Z880 Allergy status to penicillin: Secondary | ICD-10-CM | POA: Diagnosis not present

## 2016-10-19 DIAGNOSIS — J45909 Unspecified asthma, uncomplicated: Secondary | ICD-10-CM | POA: Diagnosis not present

## 2016-10-19 DIAGNOSIS — R456 Violent behavior: Secondary | ICD-10-CM | POA: Diagnosis not present

## 2016-10-19 DIAGNOSIS — Z008 Encounter for other general examination: Secondary | ICD-10-CM | POA: Diagnosis not present

## 2016-10-19 DIAGNOSIS — Z79899 Other long term (current) drug therapy: Secondary | ICD-10-CM | POA: Diagnosis not present

## 2016-10-19 DIAGNOSIS — R001 Bradycardia, unspecified: Secondary | ICD-10-CM | POA: Diagnosis not present

## 2016-10-19 DIAGNOSIS — N3090 Cystitis, unspecified without hematuria: Secondary | ICD-10-CM | POA: Diagnosis not present

## 2016-10-19 DIAGNOSIS — E785 Hyperlipidemia, unspecified: Secondary | ICD-10-CM | POA: Diagnosis not present

## 2016-10-20 ENCOUNTER — Other Ambulatory Visit: Payer: Self-pay

## 2016-10-20 DIAGNOSIS — F0391 Unspecified dementia with behavioral disturbance: Secondary | ICD-10-CM | POA: Diagnosis not present

## 2016-10-20 DIAGNOSIS — Z79899 Other long term (current) drug therapy: Secondary | ICD-10-CM | POA: Diagnosis not present

## 2016-10-20 DIAGNOSIS — R112 Nausea with vomiting, unspecified: Secondary | ICD-10-CM | POA: Diagnosis not present

## 2016-10-20 DIAGNOSIS — F05 Delirium due to known physiological condition: Secondary | ICD-10-CM | POA: Diagnosis not present

## 2016-10-20 DIAGNOSIS — N486 Induration penis plastica: Secondary | ICD-10-CM | POA: Diagnosis not present

## 2016-10-20 DIAGNOSIS — Z880 Allergy status to penicillin: Secondary | ICD-10-CM | POA: Diagnosis not present

## 2016-10-20 DIAGNOSIS — R509 Fever, unspecified: Secondary | ICD-10-CM | POA: Diagnosis not present

## 2016-10-20 DIAGNOSIS — E079 Disorder of thyroid, unspecified: Secondary | ICD-10-CM | POA: Diagnosis not present

## 2016-10-20 DIAGNOSIS — F0151 Vascular dementia with behavioral disturbance: Secondary | ICD-10-CM | POA: Diagnosis not present

## 2016-10-20 DIAGNOSIS — F0281 Dementia in other diseases classified elsewhere with behavioral disturbance: Secondary | ICD-10-CM | POA: Diagnosis not present

## 2016-10-20 DIAGNOSIS — N400 Enlarged prostate without lower urinary tract symptoms: Secondary | ICD-10-CM | POA: Diagnosis not present

## 2016-10-20 DIAGNOSIS — Z818 Family history of other mental and behavioral disorders: Secondary | ICD-10-CM | POA: Diagnosis not present

## 2016-10-20 DIAGNOSIS — E785 Hyperlipidemia, unspecified: Secondary | ICD-10-CM | POA: Diagnosis not present

## 2016-10-20 DIAGNOSIS — Z811 Family history of alcohol abuse and dependence: Secondary | ICD-10-CM | POA: Diagnosis not present

## 2016-10-20 DIAGNOSIS — K5792 Diverticulitis of intestine, part unspecified, without perforation or abscess without bleeding: Secondary | ICD-10-CM | POA: Diagnosis not present

## 2016-10-20 DIAGNOSIS — D649 Anemia, unspecified: Secondary | ICD-10-CM | POA: Diagnosis not present

## 2016-10-20 DIAGNOSIS — Z8042 Family history of malignant neoplasm of prostate: Secondary | ICD-10-CM | POA: Diagnosis not present

## 2016-10-20 DIAGNOSIS — I10 Essential (primary) hypertension: Secondary | ICD-10-CM | POA: Diagnosis not present

## 2016-10-20 DIAGNOSIS — Z87891 Personal history of nicotine dependence: Secondary | ICD-10-CM | POA: Diagnosis not present

## 2016-10-20 DIAGNOSIS — Z87442 Personal history of urinary calculi: Secondary | ICD-10-CM | POA: Diagnosis not present

## 2016-10-20 DIAGNOSIS — E039 Hypothyroidism, unspecified: Secondary | ICD-10-CM | POA: Diagnosis not present

## 2016-10-20 DIAGNOSIS — E876 Hypokalemia: Secondary | ICD-10-CM | POA: Diagnosis not present

## 2016-10-20 DIAGNOSIS — J45909 Unspecified asthma, uncomplicated: Secondary | ICD-10-CM | POA: Diagnosis not present

## 2016-10-20 DIAGNOSIS — G309 Alzheimer's disease, unspecified: Secondary | ICD-10-CM | POA: Diagnosis not present

## 2016-10-20 NOTE — Patient Outreach (Signed)
[facility] Care Management  10/20/2016  Nicholas Kennedy 03/25/1941 [id number]   REFERRAL DATE; 10/18/16  REFERRAL SOURCE: Emergency department census referral REFERRAL REASON: 4 or more emergency department visits in the past 6 months. CONSENT: Guardianship document on file in patients electronic medical record.   [facility].  HIPAA verified with Ms. Ramond Marrowitt.  Ms. Ramond Marrowitt states patient has around the clock care. Ms. Ramond Marrowitt states patient is currently in a behavioral health facility.  Denies patient having any needs at this time.   PLAN; RNCM will refer patient to care management assistant to close due to patient not meeting program criteria. RNCM will notify patients primary MD of closure.   George InaDavina Ashtynn Berke RN,BSN,CCM [facility] Telephonic  [phone]

## 2016-10-21 DIAGNOSIS — G301 Alzheimer's disease with late onset: Secondary | ICD-10-CM | POA: Diagnosis not present

## 2016-10-21 DIAGNOSIS — F0281 Dementia in other diseases classified elsewhere with behavioral disturbance: Secondary | ICD-10-CM | POA: Diagnosis not present

## 2016-10-21 DIAGNOSIS — R410 Disorientation, unspecified: Secondary | ICD-10-CM | POA: Diagnosis not present

## 2016-10-22 DIAGNOSIS — G301 Alzheimer's disease with late onset: Secondary | ICD-10-CM | POA: Diagnosis not present

## 2016-10-22 DIAGNOSIS — F0281 Dementia in other diseases classified elsewhere with behavioral disturbance: Secondary | ICD-10-CM | POA: Diagnosis not present

## 2016-10-22 DIAGNOSIS — R410 Disorientation, unspecified: Secondary | ICD-10-CM | POA: Diagnosis not present

## 2016-10-23 DIAGNOSIS — R410 Disorientation, unspecified: Secondary | ICD-10-CM | POA: Diagnosis not present

## 2016-10-23 DIAGNOSIS — G301 Alzheimer's disease with late onset: Secondary | ICD-10-CM | POA: Diagnosis not present

## 2016-10-23 DIAGNOSIS — F0281 Dementia in other diseases classified elsewhere with behavioral disturbance: Secondary | ICD-10-CM | POA: Diagnosis not present

## 2016-10-24 DIAGNOSIS — G301 Alzheimer's disease with late onset: Secondary | ICD-10-CM | POA: Diagnosis not present

## 2016-10-24 DIAGNOSIS — F0281 Dementia in other diseases classified elsewhere with behavioral disturbance: Secondary | ICD-10-CM | POA: Diagnosis not present

## 2016-10-25 DIAGNOSIS — G301 Alzheimer's disease with late onset: Secondary | ICD-10-CM | POA: Diagnosis not present

## 2016-10-25 DIAGNOSIS — F0281 Dementia in other diseases classified elsewhere with behavioral disturbance: Secondary | ICD-10-CM | POA: Diagnosis not present

## 2016-10-26 DIAGNOSIS — F0281 Dementia in other diseases classified elsewhere with behavioral disturbance: Secondary | ICD-10-CM | POA: Diagnosis not present

## 2016-10-26 DIAGNOSIS — G301 Alzheimer's disease with late onset: Secondary | ICD-10-CM | POA: Diagnosis not present

## 2016-10-27 DIAGNOSIS — F0281 Dementia in other diseases classified elsewhere with behavioral disturbance: Secondary | ICD-10-CM | POA: Diagnosis not present

## 2016-10-27 DIAGNOSIS — G301 Alzheimer's disease with late onset: Secondary | ICD-10-CM | POA: Diagnosis not present

## 2016-10-28 DIAGNOSIS — G301 Alzheimer's disease with late onset: Secondary | ICD-10-CM | POA: Diagnosis not present

## 2016-10-28 DIAGNOSIS — F0281 Dementia in other diseases classified elsewhere with behavioral disturbance: Secondary | ICD-10-CM | POA: Diagnosis not present

## 2016-10-29 DIAGNOSIS — R509 Fever, unspecified: Secondary | ICD-10-CM | POA: Diagnosis not present

## 2016-10-29 DIAGNOSIS — I10 Essential (primary) hypertension: Secondary | ICD-10-CM | POA: Diagnosis not present

## 2016-10-29 DIAGNOSIS — G301 Alzheimer's disease with late onset: Secondary | ICD-10-CM | POA: Diagnosis not present

## 2016-10-29 DIAGNOSIS — N400 Enlarged prostate without lower urinary tract symptoms: Secondary | ICD-10-CM | POA: Diagnosis not present

## 2016-10-29 DIAGNOSIS — F0281 Dementia in other diseases classified elsewhere with behavioral disturbance: Secondary | ICD-10-CM | POA: Diagnosis not present

## 2016-10-29 DIAGNOSIS — R05 Cough: Secondary | ICD-10-CM | POA: Diagnosis not present

## 2016-10-30 DIAGNOSIS — F0281 Dementia in other diseases classified elsewhere with behavioral disturbance: Secondary | ICD-10-CM | POA: Diagnosis not present

## 2016-10-30 DIAGNOSIS — R509 Fever, unspecified: Secondary | ICD-10-CM | POA: Diagnosis not present

## 2016-10-30 DIAGNOSIS — I10 Essential (primary) hypertension: Secondary | ICD-10-CM | POA: Diagnosis not present

## 2016-10-30 DIAGNOSIS — G301 Alzheimer's disease with late onset: Secondary | ICD-10-CM | POA: Diagnosis not present

## 2016-10-30 DIAGNOSIS — E039 Hypothyroidism, unspecified: Secondary | ICD-10-CM | POA: Diagnosis not present

## 2016-10-31 DIAGNOSIS — G301 Alzheimer's disease with late onset: Secondary | ICD-10-CM | POA: Diagnosis not present

## 2016-10-31 DIAGNOSIS — R410 Disorientation, unspecified: Secondary | ICD-10-CM | POA: Diagnosis not present

## 2016-10-31 DIAGNOSIS — F0281 Dementia in other diseases classified elsewhere with behavioral disturbance: Secondary | ICD-10-CM | POA: Diagnosis not present

## 2016-11-01 DIAGNOSIS — G301 Alzheimer's disease with late onset: Secondary | ICD-10-CM | POA: Diagnosis not present

## 2016-11-01 DIAGNOSIS — F0281 Dementia in other diseases classified elsewhere with behavioral disturbance: Secondary | ICD-10-CM | POA: Diagnosis not present

## 2016-11-01 DIAGNOSIS — R410 Disorientation, unspecified: Secondary | ICD-10-CM | POA: Diagnosis not present

## 2016-11-02 DIAGNOSIS — R410 Disorientation, unspecified: Secondary | ICD-10-CM | POA: Diagnosis not present

## 2016-11-02 DIAGNOSIS — F0281 Dementia in other diseases classified elsewhere with behavioral disturbance: Secondary | ICD-10-CM | POA: Diagnosis not present

## 2016-11-02 DIAGNOSIS — G301 Alzheimer's disease with late onset: Secondary | ICD-10-CM | POA: Diagnosis not present

## 2016-11-03 DIAGNOSIS — F0281 Dementia in other diseases classified elsewhere with behavioral disturbance: Secondary | ICD-10-CM | POA: Diagnosis not present

## 2016-11-03 DIAGNOSIS — R410 Disorientation, unspecified: Secondary | ICD-10-CM | POA: Diagnosis not present

## 2016-11-03 DIAGNOSIS — G301 Alzheimer's disease with late onset: Secondary | ICD-10-CM | POA: Diagnosis not present

## 2016-11-04 DIAGNOSIS — G301 Alzheimer's disease with late onset: Secondary | ICD-10-CM | POA: Diagnosis not present

## 2016-11-04 DIAGNOSIS — R410 Disorientation, unspecified: Secondary | ICD-10-CM | POA: Diagnosis not present

## 2016-11-04 DIAGNOSIS — F0281 Dementia in other diseases classified elsewhere with behavioral disturbance: Secondary | ICD-10-CM | POA: Diagnosis not present

## 2016-11-05 DIAGNOSIS — R410 Disorientation, unspecified: Secondary | ICD-10-CM | POA: Diagnosis not present

## 2016-11-05 DIAGNOSIS — G301 Alzheimer's disease with late onset: Secondary | ICD-10-CM | POA: Diagnosis not present

## 2016-11-05 DIAGNOSIS — F0281 Dementia in other diseases classified elsewhere with behavioral disturbance: Secondary | ICD-10-CM | POA: Diagnosis not present

## 2016-11-06 DIAGNOSIS — R410 Disorientation, unspecified: Secondary | ICD-10-CM | POA: Diagnosis not present

## 2016-11-06 DIAGNOSIS — F0281 Dementia in other diseases classified elsewhere with behavioral disturbance: Secondary | ICD-10-CM | POA: Diagnosis not present

## 2016-11-06 DIAGNOSIS — G301 Alzheimer's disease with late onset: Secondary | ICD-10-CM | POA: Diagnosis not present

## 2016-11-07 DIAGNOSIS — F0281 Dementia in other diseases classified elsewhere with behavioral disturbance: Secondary | ICD-10-CM | POA: Diagnosis not present

## 2016-11-07 DIAGNOSIS — G301 Alzheimer's disease with late onset: Secondary | ICD-10-CM | POA: Diagnosis not present

## 2016-11-08 DIAGNOSIS — G301 Alzheimer's disease with late onset: Secondary | ICD-10-CM | POA: Diagnosis not present

## 2016-11-08 DIAGNOSIS — F0281 Dementia in other diseases classified elsewhere with behavioral disturbance: Secondary | ICD-10-CM | POA: Diagnosis not present

## 2016-11-09 DIAGNOSIS — G301 Alzheimer's disease with late onset: Secondary | ICD-10-CM | POA: Diagnosis not present

## 2016-11-09 DIAGNOSIS — F0281 Dementia in other diseases classified elsewhere with behavioral disturbance: Secondary | ICD-10-CM | POA: Diagnosis not present

## 2016-11-09 DIAGNOSIS — R112 Nausea with vomiting, unspecified: Secondary | ICD-10-CM | POA: Diagnosis not present

## 2016-11-10 DIAGNOSIS — G301 Alzheimer's disease with late onset: Secondary | ICD-10-CM | POA: Diagnosis not present

## 2016-11-10 DIAGNOSIS — F0281 Dementia in other diseases classified elsewhere with behavioral disturbance: Secondary | ICD-10-CM | POA: Diagnosis not present

## 2016-11-11 DIAGNOSIS — F0281 Dementia in other diseases classified elsewhere with behavioral disturbance: Secondary | ICD-10-CM | POA: Diagnosis not present

## 2016-11-11 DIAGNOSIS — G301 Alzheimer's disease with late onset: Secondary | ICD-10-CM | POA: Diagnosis not present

## 2016-11-12 DIAGNOSIS — F0281 Dementia in other diseases classified elsewhere with behavioral disturbance: Secondary | ICD-10-CM | POA: Diagnosis not present

## 2016-11-12 DIAGNOSIS — G301 Alzheimer's disease with late onset: Secondary | ICD-10-CM | POA: Diagnosis not present

## 2016-11-13 DIAGNOSIS — F0281 Dementia in other diseases classified elsewhere with behavioral disturbance: Secondary | ICD-10-CM | POA: Diagnosis not present

## 2016-11-13 DIAGNOSIS — G301 Alzheimer's disease with late onset: Secondary | ICD-10-CM | POA: Diagnosis not present

## 2016-11-14 DIAGNOSIS — F0281 Dementia in other diseases classified elsewhere with behavioral disturbance: Secondary | ICD-10-CM | POA: Diagnosis not present

## 2016-11-14 DIAGNOSIS — G301 Alzheimer's disease with late onset: Secondary | ICD-10-CM | POA: Diagnosis not present

## 2016-11-14 DIAGNOSIS — R410 Disorientation, unspecified: Secondary | ICD-10-CM | POA: Diagnosis not present

## 2016-11-15 DIAGNOSIS — R410 Disorientation, unspecified: Secondary | ICD-10-CM | POA: Diagnosis not present

## 2016-11-15 DIAGNOSIS — F0281 Dementia in other diseases classified elsewhere with behavioral disturbance: Secondary | ICD-10-CM | POA: Diagnosis not present

## 2016-11-15 DIAGNOSIS — G301 Alzheimer's disease with late onset: Secondary | ICD-10-CM | POA: Diagnosis not present

## 2016-11-16 DIAGNOSIS — R410 Disorientation, unspecified: Secondary | ICD-10-CM | POA: Diagnosis not present

## 2016-11-16 DIAGNOSIS — F0281 Dementia in other diseases classified elsewhere with behavioral disturbance: Secondary | ICD-10-CM | POA: Diagnosis not present

## 2016-11-16 DIAGNOSIS — G301 Alzheimer's disease with late onset: Secondary | ICD-10-CM | POA: Diagnosis not present

## 2016-11-17 DIAGNOSIS — G301 Alzheimer's disease with late onset: Secondary | ICD-10-CM | POA: Diagnosis not present

## 2016-11-17 DIAGNOSIS — F0281 Dementia in other diseases classified elsewhere with behavioral disturbance: Secondary | ICD-10-CM | POA: Diagnosis not present

## 2016-11-17 DIAGNOSIS — R410 Disorientation, unspecified: Secondary | ICD-10-CM | POA: Diagnosis not present

## 2016-11-18 DIAGNOSIS — R410 Disorientation, unspecified: Secondary | ICD-10-CM | POA: Diagnosis not present

## 2016-11-18 DIAGNOSIS — G301 Alzheimer's disease with late onset: Secondary | ICD-10-CM | POA: Diagnosis not present

## 2016-11-18 DIAGNOSIS — F0281 Dementia in other diseases classified elsewhere with behavioral disturbance: Secondary | ICD-10-CM | POA: Diagnosis not present

## 2016-11-19 DIAGNOSIS — R410 Disorientation, unspecified: Secondary | ICD-10-CM | POA: Diagnosis not present

## 2016-11-19 DIAGNOSIS — F0281 Dementia in other diseases classified elsewhere with behavioral disturbance: Secondary | ICD-10-CM | POA: Diagnosis not present

## 2016-11-19 DIAGNOSIS — G301 Alzheimer's disease with late onset: Secondary | ICD-10-CM | POA: Diagnosis not present

## 2016-11-20 DIAGNOSIS — G301 Alzheimer's disease with late onset: Secondary | ICD-10-CM | POA: Diagnosis not present

## 2016-11-20 DIAGNOSIS — F0281 Dementia in other diseases classified elsewhere with behavioral disturbance: Secondary | ICD-10-CM | POA: Diagnosis not present

## 2016-11-20 DIAGNOSIS — R410 Disorientation, unspecified: Secondary | ICD-10-CM | POA: Diagnosis not present

## 2016-11-21 DIAGNOSIS — I10 Essential (primary) hypertension: Secondary | ICD-10-CM | POA: Diagnosis not present

## 2016-11-21 DIAGNOSIS — F331 Major depressive disorder, recurrent, moderate: Secondary | ICD-10-CM | POA: Diagnosis not present

## 2016-11-21 DIAGNOSIS — D649 Anemia, unspecified: Secondary | ICD-10-CM | POA: Diagnosis not present

## 2016-11-21 DIAGNOSIS — R41841 Cognitive communication deficit: Secondary | ICD-10-CM | POA: Diagnosis not present

## 2016-11-21 DIAGNOSIS — F0281 Dementia in other diseases classified elsewhere with behavioral disturbance: Secondary | ICD-10-CM | POA: Diagnosis not present

## 2016-11-21 DIAGNOSIS — M6281 Muscle weakness (generalized): Secondary | ICD-10-CM | POA: Diagnosis not present

## 2016-11-21 DIAGNOSIS — R278 Other lack of coordination: Secondary | ICD-10-CM | POA: Diagnosis not present

## 2016-11-21 DIAGNOSIS — E039 Hypothyroidism, unspecified: Secondary | ICD-10-CM | POA: Diagnosis not present

## 2016-11-21 DIAGNOSIS — Z7409 Other reduced mobility: Secondary | ICD-10-CM | POA: Diagnosis not present

## 2016-11-21 DIAGNOSIS — G309 Alzheimer's disease, unspecified: Secondary | ICD-10-CM | POA: Diagnosis not present

## 2016-11-21 DIAGNOSIS — F0391 Unspecified dementia with behavioral disturbance: Secondary | ICD-10-CM | POA: Diagnosis not present

## 2016-11-21 DIAGNOSIS — F0151 Vascular dementia with behavioral disturbance: Secondary | ICD-10-CM | POA: Diagnosis not present

## 2016-11-21 DIAGNOSIS — N400 Enlarged prostate without lower urinary tract symptoms: Secondary | ICD-10-CM | POA: Diagnosis not present

## 2016-11-22 DIAGNOSIS — F0391 Unspecified dementia with behavioral disturbance: Secondary | ICD-10-CM | POA: Diagnosis not present

## 2016-11-22 DIAGNOSIS — G4701 Insomnia due to medical condition: Secondary | ICD-10-CM | POA: Diagnosis not present

## 2016-11-22 DIAGNOSIS — N400 Enlarged prostate without lower urinary tract symptoms: Secondary | ICD-10-CM | POA: Diagnosis not present

## 2016-11-22 DIAGNOSIS — E039 Hypothyroidism, unspecified: Secondary | ICD-10-CM | POA: Diagnosis not present

## 2016-11-24 DIAGNOSIS — F0391 Unspecified dementia with behavioral disturbance: Secondary | ICD-10-CM | POA: Diagnosis not present

## 2016-11-24 DIAGNOSIS — G4700 Insomnia, unspecified: Secondary | ICD-10-CM | POA: Diagnosis not present

## 2016-11-24 DIAGNOSIS — F0151 Vascular dementia with behavioral disturbance: Secondary | ICD-10-CM | POA: Diagnosis not present

## 2016-11-24 DIAGNOSIS — F329 Major depressive disorder, single episode, unspecified: Secondary | ICD-10-CM | POA: Diagnosis not present

## 2016-11-29 DIAGNOSIS — R609 Edema, unspecified: Secondary | ICD-10-CM | POA: Diagnosis not present

## 2016-11-29 DIAGNOSIS — R419 Unspecified symptoms and signs involving cognitive functions and awareness: Secondary | ICD-10-CM | POA: Diagnosis not present

## 2016-11-29 DIAGNOSIS — M109 Gout, unspecified: Secondary | ICD-10-CM | POA: Diagnosis not present

## 2016-11-29 DIAGNOSIS — R54 Age-related physical debility: Secondary | ICD-10-CM | POA: Diagnosis not present

## 2016-11-29 DIAGNOSIS — D649 Anemia, unspecified: Secondary | ICD-10-CM | POA: Diagnosis not present

## 2016-11-29 DIAGNOSIS — F0391 Unspecified dementia with behavioral disturbance: Secondary | ICD-10-CM | POA: Diagnosis not present

## 2016-11-30 DIAGNOSIS — D649 Anemia, unspecified: Secondary | ICD-10-CM | POA: Diagnosis not present

## 2016-11-30 DIAGNOSIS — M109 Gout, unspecified: Secondary | ICD-10-CM | POA: Diagnosis not present

## 2016-11-30 DIAGNOSIS — R609 Edema, unspecified: Secondary | ICD-10-CM | POA: Diagnosis not present

## 2016-11-30 DIAGNOSIS — R54 Age-related physical debility: Secondary | ICD-10-CM | POA: Diagnosis not present

## 2016-11-30 DIAGNOSIS — F0391 Unspecified dementia with behavioral disturbance: Secondary | ICD-10-CM | POA: Diagnosis not present

## 2016-11-30 DIAGNOSIS — R419 Unspecified symptoms and signs involving cognitive functions and awareness: Secondary | ICD-10-CM | POA: Diagnosis not present

## 2016-12-02 DIAGNOSIS — F0391 Unspecified dementia with behavioral disturbance: Secondary | ICD-10-CM | POA: Diagnosis not present

## 2016-12-02 DIAGNOSIS — F329 Major depressive disorder, single episode, unspecified: Secondary | ICD-10-CM | POA: Diagnosis not present

## 2016-12-02 DIAGNOSIS — G4700 Insomnia, unspecified: Secondary | ICD-10-CM | POA: Diagnosis not present

## 2016-12-02 DIAGNOSIS — F0151 Vascular dementia with behavioral disturbance: Secondary | ICD-10-CM | POA: Diagnosis not present

## 2016-12-14 ENCOUNTER — Encounter: Payer: Self-pay | Admitting: Gastroenterology

## 2016-12-15 DIAGNOSIS — F0391 Unspecified dementia with behavioral disturbance: Secondary | ICD-10-CM | POA: Diagnosis not present

## 2016-12-15 DIAGNOSIS — F0151 Vascular dementia with behavioral disturbance: Secondary | ICD-10-CM | POA: Diagnosis not present

## 2016-12-15 DIAGNOSIS — F329 Major depressive disorder, single episode, unspecified: Secondary | ICD-10-CM | POA: Diagnosis not present

## 2016-12-15 DIAGNOSIS — G4700 Insomnia, unspecified: Secondary | ICD-10-CM | POA: Diagnosis not present

## 2016-12-18 DIAGNOSIS — F329 Major depressive disorder, single episode, unspecified: Secondary | ICD-10-CM | POA: Diagnosis not present

## 2016-12-18 DIAGNOSIS — F0151 Vascular dementia with behavioral disturbance: Secondary | ICD-10-CM | POA: Diagnosis not present

## 2016-12-18 DIAGNOSIS — G4700 Insomnia, unspecified: Secondary | ICD-10-CM | POA: Diagnosis not present

## 2016-12-18 DIAGNOSIS — F0391 Unspecified dementia with behavioral disturbance: Secondary | ICD-10-CM | POA: Diagnosis not present

## 2016-12-19 DIAGNOSIS — R451 Restlessness and agitation: Secondary | ICD-10-CM | POA: Diagnosis not present

## 2016-12-19 DIAGNOSIS — F419 Anxiety disorder, unspecified: Secondary | ICD-10-CM | POA: Diagnosis not present

## 2016-12-19 DIAGNOSIS — G4701 Insomnia due to medical condition: Secondary | ICD-10-CM | POA: Diagnosis not present

## 2016-12-19 DIAGNOSIS — F0391 Unspecified dementia with behavioral disturbance: Secondary | ICD-10-CM | POA: Diagnosis not present

## 2016-12-24 DIAGNOSIS — E039 Hypothyroidism, unspecified: Secondary | ICD-10-CM | POA: Diagnosis not present

## 2016-12-24 DIAGNOSIS — F329 Major depressive disorder, single episode, unspecified: Secondary | ICD-10-CM | POA: Diagnosis not present

## 2016-12-24 DIAGNOSIS — F0391 Unspecified dementia with behavioral disturbance: Secondary | ICD-10-CM | POA: Diagnosis not present

## 2016-12-24 DIAGNOSIS — F39 Unspecified mood [affective] disorder: Secondary | ICD-10-CM | POA: Diagnosis not present

## 2016-12-26 DIAGNOSIS — G4701 Insomnia due to medical condition: Secondary | ICD-10-CM | POA: Diagnosis not present

## 2016-12-26 DIAGNOSIS — F0391 Unspecified dementia with behavioral disturbance: Secondary | ICD-10-CM | POA: Diagnosis not present

## 2016-12-26 DIAGNOSIS — R54 Age-related physical debility: Secondary | ICD-10-CM | POA: Diagnosis not present

## 2016-12-26 DIAGNOSIS — F419 Anxiety disorder, unspecified: Secondary | ICD-10-CM | POA: Diagnosis not present

## 2016-12-30 DIAGNOSIS — G4700 Insomnia, unspecified: Secondary | ICD-10-CM | POA: Diagnosis not present

## 2016-12-30 DIAGNOSIS — F0151 Vascular dementia with behavioral disturbance: Secondary | ICD-10-CM | POA: Diagnosis not present

## 2016-12-30 DIAGNOSIS — F0391 Unspecified dementia with behavioral disturbance: Secondary | ICD-10-CM | POA: Diagnosis not present

## 2016-12-30 DIAGNOSIS — F329 Major depressive disorder, single episode, unspecified: Secondary | ICD-10-CM | POA: Diagnosis not present

## 2017-01-11 DIAGNOSIS — G4700 Insomnia, unspecified: Secondary | ICD-10-CM | POA: Diagnosis not present

## 2017-01-11 DIAGNOSIS — F329 Major depressive disorder, single episode, unspecified: Secondary | ICD-10-CM | POA: Diagnosis not present

## 2017-01-11 DIAGNOSIS — F0391 Unspecified dementia with behavioral disturbance: Secondary | ICD-10-CM | POA: Diagnosis not present

## 2017-01-11 DIAGNOSIS — F0151 Vascular dementia with behavioral disturbance: Secondary | ICD-10-CM | POA: Diagnosis not present

## 2017-01-19 DIAGNOSIS — R1312 Dysphagia, oropharyngeal phase: Secondary | ICD-10-CM | POA: Diagnosis not present

## 2017-01-19 DIAGNOSIS — G309 Alzheimer's disease, unspecified: Secondary | ICD-10-CM | POA: Diagnosis not present

## 2017-01-19 DIAGNOSIS — M79651 Pain in right thigh: Secondary | ICD-10-CM | POA: Diagnosis not present

## 2017-01-19 DIAGNOSIS — F0151 Vascular dementia with behavioral disturbance: Secondary | ICD-10-CM | POA: Diagnosis not present

## 2017-01-19 DIAGNOSIS — R278 Other lack of coordination: Secondary | ICD-10-CM | POA: Diagnosis not present

## 2017-01-19 DIAGNOSIS — M6281 Muscle weakness (generalized): Secondary | ICD-10-CM | POA: Diagnosis not present

## 2017-01-19 DIAGNOSIS — M25551 Pain in right hip: Secondary | ICD-10-CM | POA: Diagnosis not present

## 2017-01-22 DIAGNOSIS — F0151 Vascular dementia with behavioral disturbance: Secondary | ICD-10-CM | POA: Diagnosis not present

## 2017-01-22 DIAGNOSIS — F0391 Unspecified dementia with behavioral disturbance: Secondary | ICD-10-CM | POA: Diagnosis not present

## 2017-01-22 DIAGNOSIS — G4700 Insomnia, unspecified: Secondary | ICD-10-CM | POA: Diagnosis not present

## 2017-01-22 DIAGNOSIS — F329 Major depressive disorder, single episode, unspecified: Secondary | ICD-10-CM | POA: Diagnosis not present

## 2017-01-30 DIAGNOSIS — L89323 Pressure ulcer of left buttock, stage 3: Secondary | ICD-10-CM | POA: Diagnosis not present

## 2017-01-31 DIAGNOSIS — R402431 Glasgow coma scale score 3-8, in the field [EMT or ambulance]: Secondary | ICD-10-CM | POA: Diagnosis not present

## 2017-01-31 DIAGNOSIS — R0603 Acute respiratory distress: Secondary | ICD-10-CM | POA: Diagnosis not present

## 2017-01-31 DIAGNOSIS — Z66 Do not resuscitate: Secondary | ICD-10-CM | POA: Diagnosis not present

## 2017-01-31 DIAGNOSIS — R5383 Other fatigue: Secondary | ICD-10-CM | POA: Diagnosis not present

## 2017-01-31 DIAGNOSIS — Z9089 Acquired absence of other organs: Secondary | ICD-10-CM | POA: Diagnosis not present

## 2017-01-31 DIAGNOSIS — R509 Fever, unspecified: Secondary | ICD-10-CM | POA: Diagnosis not present

## 2017-01-31 DIAGNOSIS — R092 Respiratory arrest: Secondary | ICD-10-CM | POA: Diagnosis not present

## 2017-01-31 DIAGNOSIS — Z880 Allergy status to penicillin: Secondary | ICD-10-CM | POA: Diagnosis not present

## 2017-01-31 DIAGNOSIS — Z87891 Personal history of nicotine dependence: Secondary | ICD-10-CM | POA: Diagnosis not present

## 2017-01-31 DIAGNOSIS — Z79899 Other long term (current) drug therapy: Secondary | ICD-10-CM | POA: Diagnosis not present

## 2017-01-31 DIAGNOSIS — J9690 Respiratory failure, unspecified, unspecified whether with hypoxia or hypercapnia: Secondary | ICD-10-CM | POA: Diagnosis not present

## 2017-01-31 DIAGNOSIS — F0280 Dementia in other diseases classified elsewhere without behavioral disturbance: Secondary | ICD-10-CM | POA: Diagnosis not present

## 2017-01-31 DIAGNOSIS — I469 Cardiac arrest, cause unspecified: Secondary | ICD-10-CM | POA: Diagnosis not present

## 2017-01-31 DIAGNOSIS — R4182 Altered mental status, unspecified: Secondary | ICD-10-CM | POA: Diagnosis not present

## 2017-01-31 DIAGNOSIS — G309 Alzheimer's disease, unspecified: Secondary | ICD-10-CM | POA: Diagnosis not present

## 2017-02-01 ENCOUNTER — Telehealth: Payer: Self-pay | Admitting: Internal Medicine

## 2017-02-01 NOTE — Telephone Encounter (Signed)
PLEASE NOTE: All timestamps contained within this report are represented as [place] Standard Time. CONFIDENTIALTY NOTICE: This fax transmission is intended only for the addressee. It contains information that is legally privileged, confidential or otherwise protected from use or disclosure. If you are not the intended recipient, you are strictly prohibited from reviewing, disclosing, copying using or disseminating any of this information or taking any action in reliance on or regarding this information. If you have received this fax in error, please notify us immediately by telephone so that we can arrange for its return to us. Phone: [phone], Toll-Free: [phone], Fax: [phone] Page: 1 of 1 Call Id: [id number] [facility] - Client Nonclinical Telephone Record [facility] Client [facility] - [facility] Client Site  [facility] Physician Tillman AbideLetvak, Richard - MD Contact Type Call Who Is Calling Physician / Provider / Hospital Call Type Provider Call [facility] Reason for Call Request to speak to Physician Initial Comment [facility] needs to speak with the on cal l regarding a pt Additional Comment Patient Name Nicholas Kennedy Patient DOB May 02, 1941 Requesting Provider Dr Teodoro KilAtstupenas Physician Number [phone] [facility] Paging DoctorName Phone DateTime Result/Outcome Message Type Notes Dale DurhamScott, Charlene - MD [phone] 29-May-2017 5:17:59 PM Called On Call Provider - Reached Doctor Paged Dale DurhamScott, Charlene - MD 29-May-2017 5:21:43 PM Spoke with On Call - General Message Result Spoke with On Call, information provided and warm conference the call. Call Closed By: Macario CarlsIngrid Hurst Transaction Date/Time: 29-May-2017 5:08:09 PM (ET)

## 2017-02-01 NOTE — Telephone Encounter (Signed)
Late entry.  Dr Teodoro KilAtstupenas from [facility] called and reported that Nicholas Kennedy was evaluated in ER.  States Nicholas Kennedy has h/o alzheimers and resides at a nursing facility.  Per report, became unresponsive.  Had respiratory issues.  Was a DNR, but family requested intubation, but no CPR.  Pt pronounced - deceased - 1624 (05-20-2017).   Death certificate to be sent to office for PCP to sign.

## 2017-02-01 NOTE — Telephone Encounter (Signed)
He is not under my care anymore---since moving into facility

## 2017-02-08 DEATH — deceased

## 2017-04-01 IMAGING — DX DG CHEST 1V PORT
1 series · 1 of 1 positions shown · non-contrast
Comparison: Chest x-ray dated 04/09/2016.

CLINICAL DATA: Medical clearance.  History of asthma.

EXAM:
PORTABLE CHEST 1 VIEW

[chest ap]
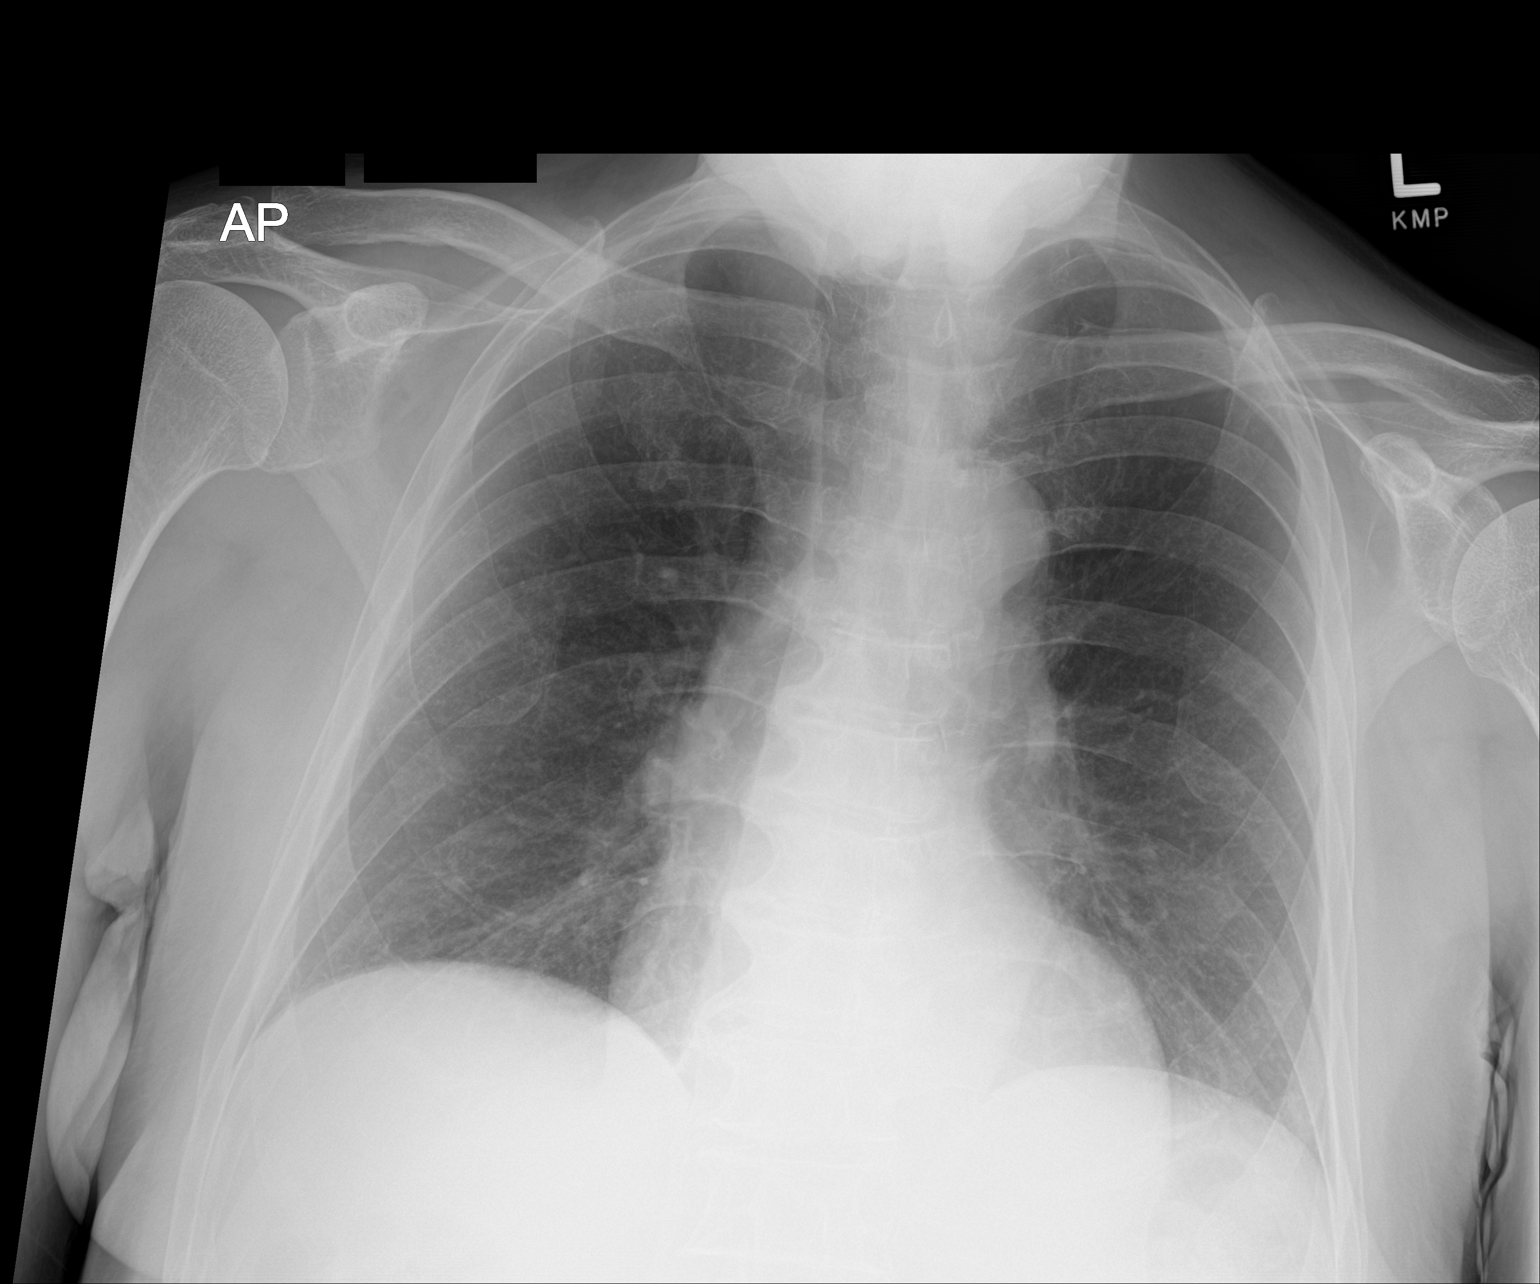

[1 of 1 positions shown; findings below may reference images not displayed]

FINDINGS: The heart size and mediastinal contours are within normal limits.
Both lungs are clear. The visualized skeletal structures are
unremarkable.
IMPRESSION: No active disease.

## 2017-04-11 IMAGING — CT CT CERVICAL SPINE W/O CM
4 of 8 series · 12 of 33 positions shown, 13 images · non-contrast
Comparison: 10/10/2016

CLINICAL DATA: Witnessed fall. No loss of consciousness. Laceration
to the right forehead. History of dementia.

EXAM:
CT HEAD WITHOUT CONTRAST
CT CERVICAL SPINE WITHOUT CONTRAST
TECHNIQUE: Multidetector CT imaging of the head and cervical spine was
performed following the standard protocol without intravenous
contrast. Multiplanar CT image reconstructions of the cervical spine
were also generated.

[Series 5: coronal · coronal · 0.31mm/px · 1 of 67 slices shown]
[im 34/67  bone]
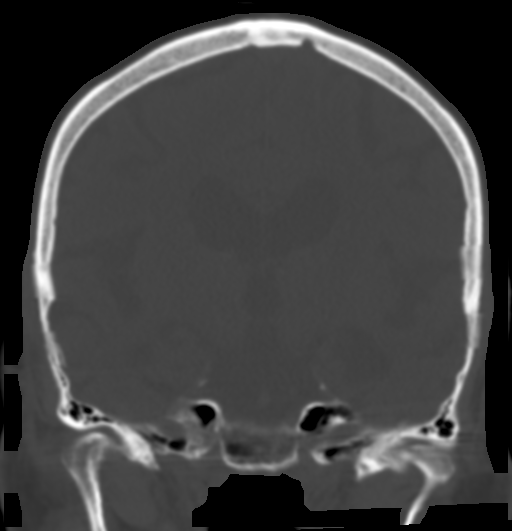

[Series 7: c-spine st · axial · 0.38mm/px · z∈[-177,-77]mm · 3 of 101 slices shown, 4 images]
[im 26/101  soft-tissue]
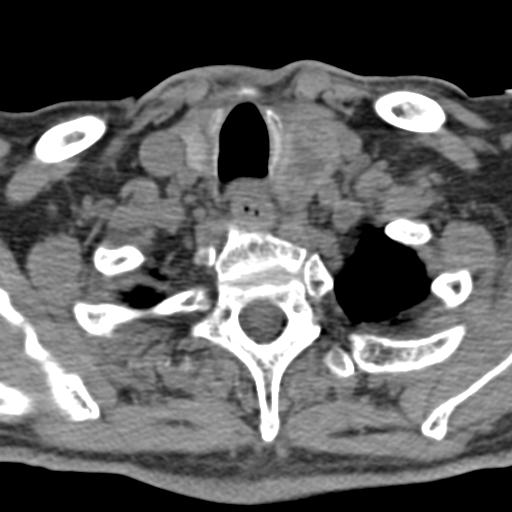
[im 26/101  bone]
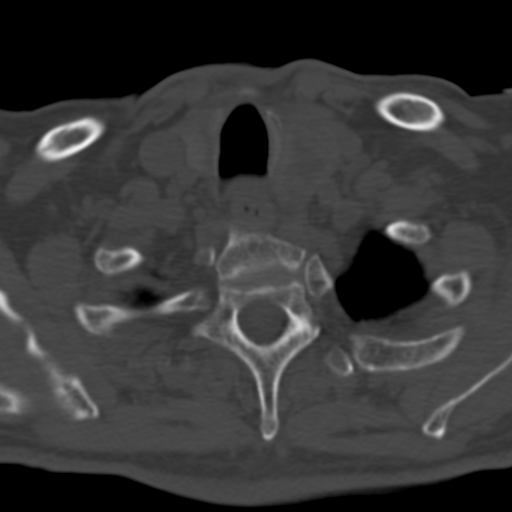
[im 51/101  bone]
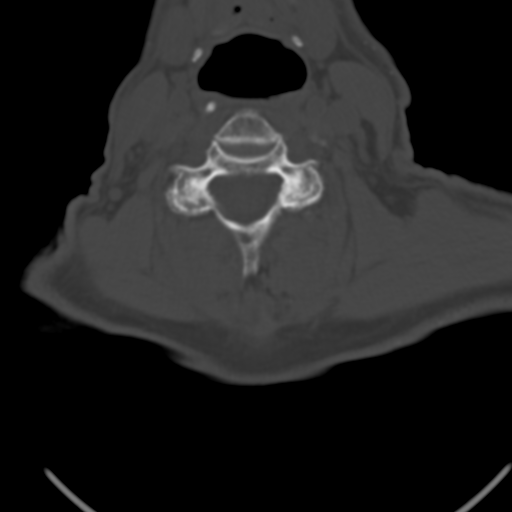
[im 76/101  bone]
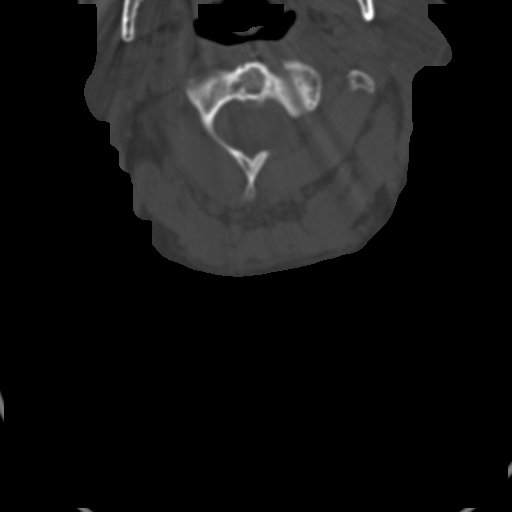

[Series 9: axial recon · axial · 0.20mm/px · z∈[-192,-100]mm · 3 of 107 slices shown]
[im 27/107  bone]
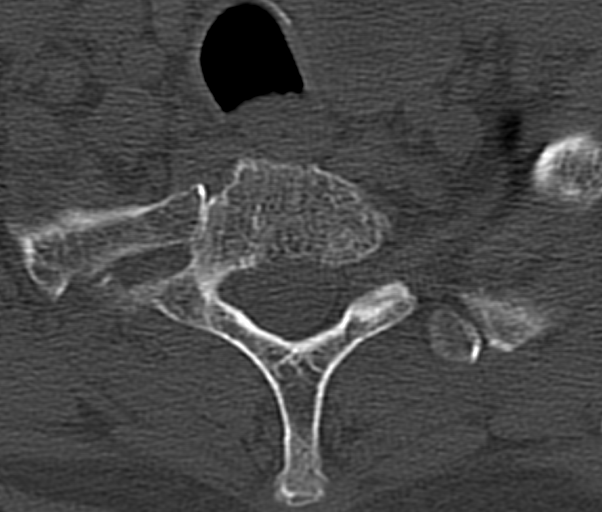
[im 54/107  bone]
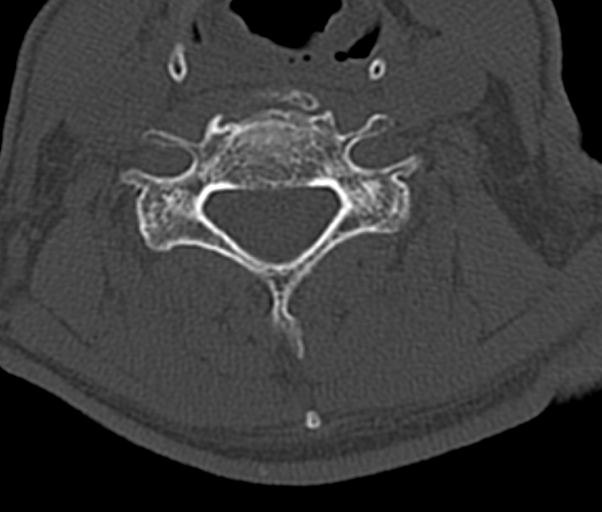
[im 80/107  bone]
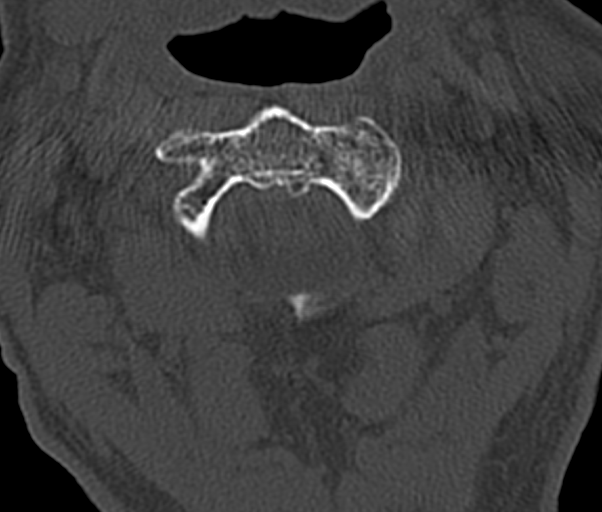

[Series 11: sagittal · sagittal · 0.22mm/px · 5 of 53 slices shown]
[im 9/53  bone]
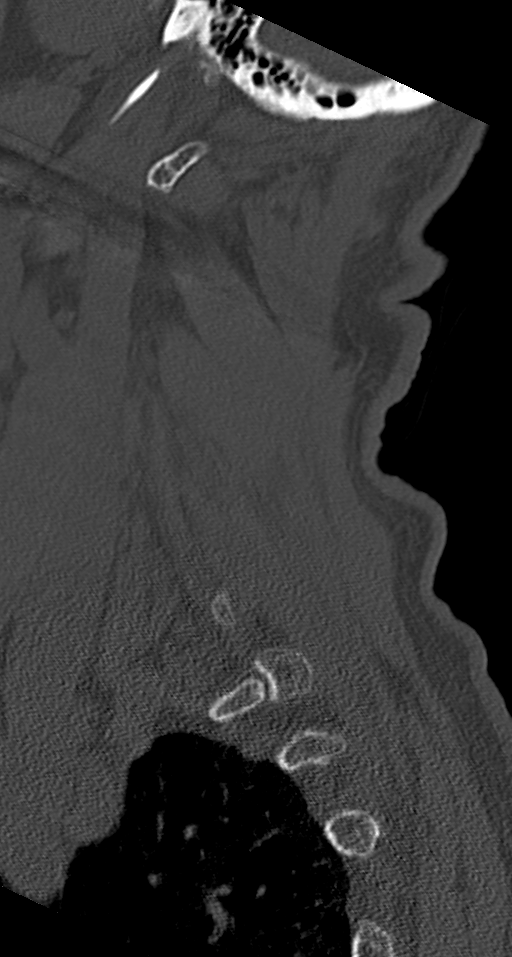
[im 18/53  bone]
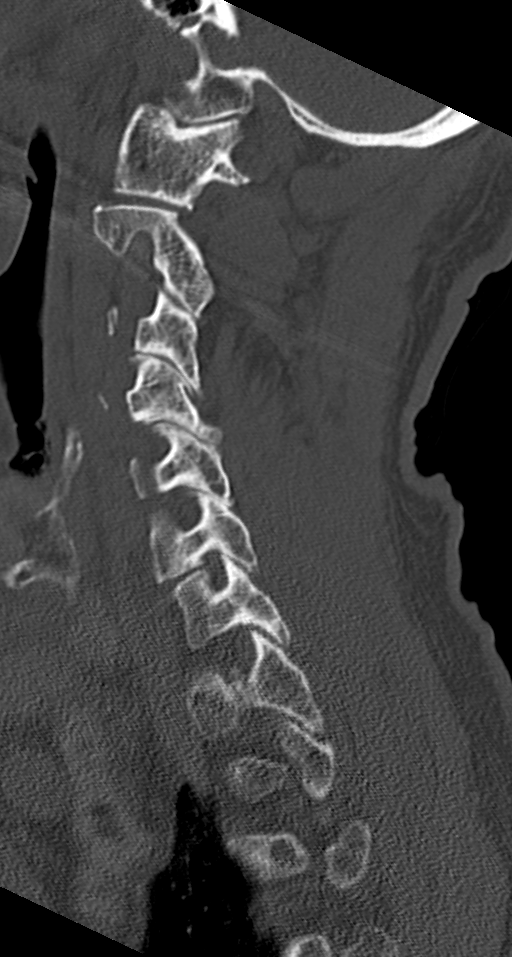
[im 27/53  bone]
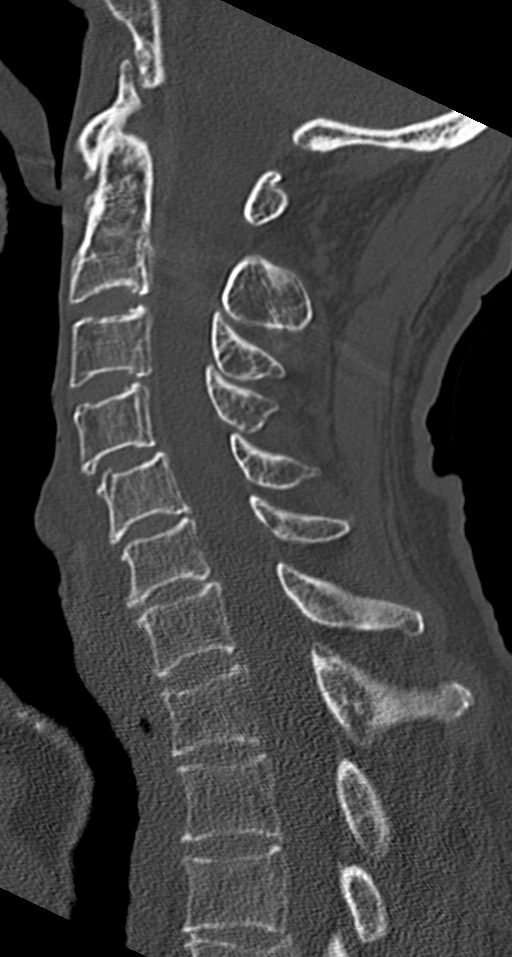
[im 35/53  bone]
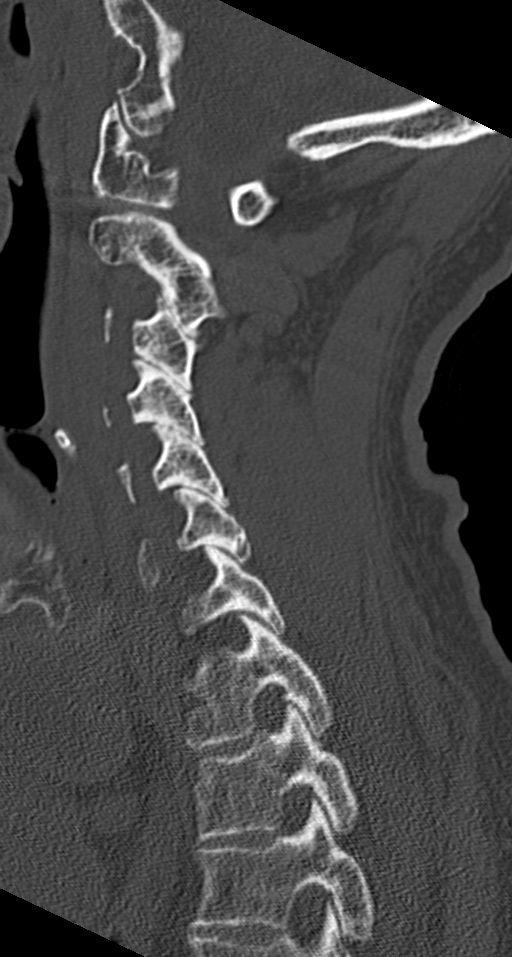
[im 44/53  bone]
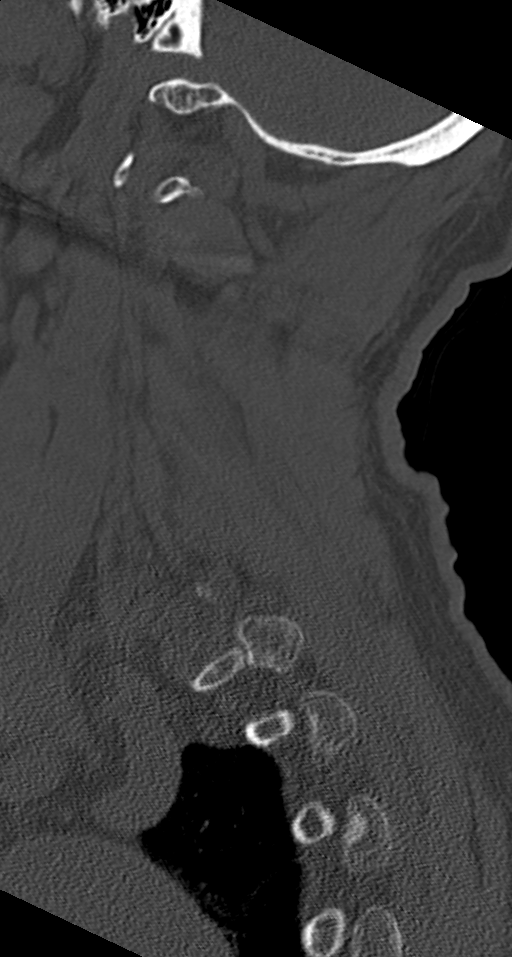

[12 of 33 positions shown; findings below may reference images not displayed]

FINDINGS: CT HEAD FINDINGS

Brain: Diffuse cerebral atrophy. Ventricular dilatation consistent
with central atrophy. Low-attenuation changes in the deep white
matter consistent with small vessel ischemia. No evidence of acute
infarction, hemorrhage, hydrocephalus, extra-axial collection or
mass lesion/mass effect.

Vascular: No hyperdense vessel or unexpected calcification.

Skull: Normal. Negative for fracture or focal lesion.

Sinuses/Orbits: No acute finding.

Other: Small subcutaneous scalp hematoma and laceration over the
right anterior frontal region.

CT CERVICAL SPINE FINDINGS

Alignment: Reversal of the usual cervical lordosis without change
since prior study, likely positional or degenerative. No anterior
subluxation. Normal alignment of facet joints.

Skull base and vertebrae: No acute fracture. No primary bone lesion
or focal pathologic process.

Soft tissues and spinal canal: No prevertebral fluid or swelling. No
visible canal hematoma.

Disc levels: Degenerative changes throughout the cervical spine with
narrowed interspaces and endplate hypertrophic changes. Degenerative
changes throughout the facet joints.

Upper chest: Left thyroid mass measuring 3.3 cm diameter. No change.

Other: None.
IMPRESSION: No acute intracranial abnormalities. Diffuse atrophy and small
vessel ischemic changes.

Chronic reversal of the usual cervical lordosis. Degenerative
changes throughout the cervical spine. No acute displaced fractures.
Large left thyroid mass. Consider further evaluation with ultrasound
if clinically indicated.
